# Patient Record
Sex: Female | Born: 1940
Health system: Southern US, Community
[De-identification: ages and names within clinical notes are randomized; demographics above are authoritative.]

## PROBLEM LIST (undated history)

## (undated) DIAGNOSIS — E119 Type 2 diabetes mellitus without complications: Secondary | ICD-10-CM

## (undated) DIAGNOSIS — J45909 Unspecified asthma, uncomplicated: Secondary | ICD-10-CM

## (undated) DIAGNOSIS — M1990 Unspecified osteoarthritis, unspecified site: Secondary | ICD-10-CM

## (undated) DIAGNOSIS — E669 Obesity, unspecified: Secondary | ICD-10-CM

## (undated) DIAGNOSIS — R609 Edema, unspecified: Secondary | ICD-10-CM

## (undated) DIAGNOSIS — E785 Hyperlipidemia, unspecified: Secondary | ICD-10-CM

## (undated) DIAGNOSIS — M8580 Other specified disorders of bone density and structure, unspecified site: Secondary | ICD-10-CM

## (undated) DIAGNOSIS — I10 Essential (primary) hypertension: Secondary | ICD-10-CM

## (undated) HISTORY — DX: Essential (primary) hypertension: I10

---

## 1998-06-16 ENCOUNTER — Other Ambulatory Visit: Admission: RE | Admit: 1998-06-16 | Discharge: 1998-06-16 | Payer: Self-pay | Admitting: Obstetrics and Gynecology

## 1999-07-28 ENCOUNTER — Other Ambulatory Visit: Admission: RE | Admit: 1999-07-28 | Discharge: 1999-07-28 | Payer: Self-pay | Admitting: Obstetrics and Gynecology

## 2001-01-05 ENCOUNTER — Emergency Department (HOSPITAL_COMMUNITY): Admission: EM | Admit: 2001-01-05 | Discharge: 2001-01-05 | Payer: Self-pay | Admitting: Emergency Medicine

## 2001-01-05 ENCOUNTER — Encounter: Payer: Self-pay | Admitting: Emergency Medicine

## 2001-02-19 ENCOUNTER — Other Ambulatory Visit: Admission: RE | Admit: 2001-02-19 | Discharge: 2001-02-19 | Payer: Self-pay | Admitting: Obstetrics and Gynecology

## 2002-10-15 ENCOUNTER — Other Ambulatory Visit: Admission: RE | Admit: 2002-10-15 | Discharge: 2002-10-15 | Payer: Self-pay | Admitting: Internal Medicine

## 2003-11-11 ENCOUNTER — Other Ambulatory Visit: Admission: RE | Admit: 2003-11-11 | Discharge: 2003-11-11 | Payer: Self-pay | Admitting: Obstetrics and Gynecology

## 2004-10-04 ENCOUNTER — Ambulatory Visit (HOSPITAL_COMMUNITY): Admission: RE | Admit: 2004-10-04 | Discharge: 2004-10-04 | Payer: Self-pay | Admitting: *Deleted

## 2004-10-04 ENCOUNTER — Encounter (INDEPENDENT_AMBULATORY_CARE_PROVIDER_SITE_OTHER): Payer: Self-pay | Admitting: Specialist

## 2006-04-24 ENCOUNTER — Other Ambulatory Visit: Admission: RE | Admit: 2006-04-24 | Discharge: 2006-04-24 | Payer: Self-pay | Admitting: Obstetrics and Gynecology

## 2007-04-28 ENCOUNTER — Other Ambulatory Visit: Admission: RE | Admit: 2007-04-28 | Discharge: 2007-04-28 | Payer: Self-pay | Admitting: Obstetrics and Gynecology

## 2008-05-03 ENCOUNTER — Other Ambulatory Visit: Admission: RE | Admit: 2008-05-03 | Discharge: 2008-05-03 | Payer: Self-pay | Admitting: Obstetrics and Gynecology

## 2010-08-22 ENCOUNTER — Ambulatory Visit: Payer: Medicare Other | Attending: Orthopaedic Surgery

## 2010-08-22 DIAGNOSIS — R5381 Other malaise: Secondary | ICD-10-CM | POA: Insufficient documentation

## 2010-08-22 DIAGNOSIS — M2569 Stiffness of other specified joint, not elsewhere classified: Secondary | ICD-10-CM | POA: Insufficient documentation

## 2010-08-22 DIAGNOSIS — M545 Low back pain, unspecified: Secondary | ICD-10-CM | POA: Insufficient documentation

## 2010-08-22 DIAGNOSIS — IMO0001 Reserved for inherently not codable concepts without codable children: Secondary | ICD-10-CM | POA: Insufficient documentation

## 2010-08-24 ENCOUNTER — Ambulatory Visit: Payer: Medicare Other

## 2010-08-30 ENCOUNTER — Ambulatory Visit: Payer: Medicare Other

## 2010-09-01 ENCOUNTER — Ambulatory Visit: Payer: Medicare Other

## 2010-09-06 ENCOUNTER — Ambulatory Visit: Payer: Medicare Other | Admitting: Physical Therapy

## 2010-09-08 ENCOUNTER — Ambulatory Visit: Payer: Medicare Other | Attending: Orthopaedic Surgery

## 2010-09-08 DIAGNOSIS — M545 Low back pain, unspecified: Secondary | ICD-10-CM | POA: Insufficient documentation

## 2010-09-08 DIAGNOSIS — M2569 Stiffness of other specified joint, not elsewhere classified: Secondary | ICD-10-CM | POA: Insufficient documentation

## 2010-09-08 DIAGNOSIS — R5381 Other malaise: Secondary | ICD-10-CM | POA: Insufficient documentation

## 2010-09-08 DIAGNOSIS — IMO0001 Reserved for inherently not codable concepts without codable children: Secondary | ICD-10-CM | POA: Insufficient documentation

## 2010-09-13 ENCOUNTER — Ambulatory Visit: Payer: Medicare Other

## 2010-09-15 ENCOUNTER — Ambulatory Visit: Payer: Medicare Other

## 2010-09-20 ENCOUNTER — Ambulatory Visit: Payer: Medicare Other | Admitting: Physical Therapy

## 2010-09-22 ENCOUNTER — Ambulatory Visit: Payer: Medicare Other

## 2011-07-10 HISTORY — PX: SPINAL FUSION: SHX223

## 2011-08-02 DIAGNOSIS — H2510 Age-related nuclear cataract, unspecified eye: Secondary | ICD-10-CM | POA: Diagnosis not present

## 2011-08-02 DIAGNOSIS — E119 Type 2 diabetes mellitus without complications: Secondary | ICD-10-CM | POA: Diagnosis not present

## 2011-10-04 DIAGNOSIS — E119 Type 2 diabetes mellitus without complications: Secondary | ICD-10-CM | POA: Diagnosis not present

## 2011-10-04 DIAGNOSIS — I10 Essential (primary) hypertension: Secondary | ICD-10-CM | POA: Diagnosis not present

## 2011-10-04 DIAGNOSIS — R609 Edema, unspecified: Secondary | ICD-10-CM | POA: Diagnosis not present

## 2011-10-04 DIAGNOSIS — E782 Mixed hyperlipidemia: Secondary | ICD-10-CM | POA: Diagnosis not present

## 2011-10-12 DIAGNOSIS — M48062 Spinal stenosis, lumbar region with neurogenic claudication: Secondary | ICD-10-CM | POA: Diagnosis not present

## 2011-10-12 DIAGNOSIS — IMO0002 Reserved for concepts with insufficient information to code with codable children: Secondary | ICD-10-CM | POA: Diagnosis not present

## 2011-10-12 DIAGNOSIS — M4310 Spondylolisthesis, site unspecified: Secondary | ICD-10-CM | POA: Diagnosis not present

## 2011-11-15 DIAGNOSIS — I10 Essential (primary) hypertension: Secondary | ICD-10-CM | POA: Diagnosis not present

## 2011-11-15 DIAGNOSIS — M5137 Other intervertebral disc degeneration, lumbosacral region: Secondary | ICD-10-CM | POA: Diagnosis not present

## 2011-11-15 DIAGNOSIS — M48061 Spinal stenosis, lumbar region without neurogenic claudication: Secondary | ICD-10-CM | POA: Diagnosis not present

## 2011-11-15 DIAGNOSIS — Q762 Congenital spondylolisthesis: Secondary | ICD-10-CM | POA: Diagnosis not present

## 2011-11-15 DIAGNOSIS — M48062 Spinal stenosis, lumbar region with neurogenic claudication: Secondary | ICD-10-CM | POA: Diagnosis not present

## 2011-11-15 DIAGNOSIS — R918 Other nonspecific abnormal finding of lung field: Secondary | ICD-10-CM | POA: Diagnosis not present

## 2011-11-15 DIAGNOSIS — IMO0002 Reserved for concepts with insufficient information to code with codable children: Secondary | ICD-10-CM | POA: Diagnosis not present

## 2011-11-15 DIAGNOSIS — R791 Abnormal coagulation profile: Secondary | ICD-10-CM | POA: Diagnosis not present

## 2011-11-15 DIAGNOSIS — R82998 Other abnormal findings in urine: Secondary | ICD-10-CM | POA: Diagnosis not present

## 2011-11-15 DIAGNOSIS — M4120 Other idiopathic scoliosis, site unspecified: Secondary | ICD-10-CM | POA: Diagnosis not present

## 2011-11-15 DIAGNOSIS — M545 Low back pain, unspecified: Secondary | ICD-10-CM | POA: Diagnosis not present

## 2011-11-15 DIAGNOSIS — R7989 Other specified abnormal findings of blood chemistry: Secondary | ICD-10-CM | POA: Diagnosis not present

## 2011-11-15 DIAGNOSIS — E119 Type 2 diabetes mellitus without complications: Secondary | ICD-10-CM | POA: Diagnosis not present

## 2011-11-15 DIAGNOSIS — M47817 Spondylosis without myelopathy or radiculopathy, lumbosacral region: Secondary | ICD-10-CM | POA: Diagnosis not present

## 2011-11-15 DIAGNOSIS — M4310 Spondylolisthesis, site unspecified: Secondary | ICD-10-CM | POA: Diagnosis not present

## 2011-11-15 DIAGNOSIS — Z01818 Encounter for other preprocedural examination: Secondary | ICD-10-CM | POA: Diagnosis not present

## 2011-11-15 DIAGNOSIS — J45909 Unspecified asthma, uncomplicated: Secondary | ICD-10-CM | POA: Diagnosis not present

## 2011-12-05 DIAGNOSIS — E119 Type 2 diabetes mellitus without complications: Secondary | ICD-10-CM | POA: Diagnosis not present

## 2011-12-05 DIAGNOSIS — Z981 Arthrodesis status: Secondary | ICD-10-CM | POA: Diagnosis not present

## 2011-12-05 DIAGNOSIS — M48061 Spinal stenosis, lumbar region without neurogenic claudication: Secondary | ICD-10-CM | POA: Diagnosis not present

## 2011-12-05 DIAGNOSIS — I10 Essential (primary) hypertension: Secondary | ICD-10-CM | POA: Diagnosis not present

## 2011-12-05 DIAGNOSIS — J45909 Unspecified asthma, uncomplicated: Secondary | ICD-10-CM | POA: Diagnosis not present

## 2011-12-05 DIAGNOSIS — M4180 Other forms of scoliosis, site unspecified: Secondary | ICD-10-CM | POA: Diagnosis not present

## 2011-12-05 DIAGNOSIS — M4310 Spondylolisthesis, site unspecified: Secondary | ICD-10-CM | POA: Diagnosis not present

## 2011-12-05 DIAGNOSIS — D72829 Elevated white blood cell count, unspecified: Secondary | ICD-10-CM | POA: Diagnosis not present

## 2011-12-05 DIAGNOSIS — M48062 Spinal stenosis, lumbar region with neurogenic claudication: Secondary | ICD-10-CM | POA: Diagnosis not present

## 2011-12-05 DIAGNOSIS — M4120 Other idiopathic scoliosis, site unspecified: Secondary | ICD-10-CM | POA: Diagnosis present

## 2011-12-05 DIAGNOSIS — Z6841 Body Mass Index (BMI) 40.0 and over, adult: Secondary | ICD-10-CM | POA: Diagnosis not present

## 2011-12-05 DIAGNOSIS — D62 Acute posthemorrhagic anemia: Secondary | ICD-10-CM | POA: Diagnosis not present

## 2011-12-05 DIAGNOSIS — Z5189 Encounter for other specified aftercare: Secondary | ICD-10-CM | POA: Diagnosis not present

## 2011-12-05 DIAGNOSIS — IMO0002 Reserved for concepts with insufficient information to code with codable children: Secondary | ICD-10-CM | POA: Diagnosis not present

## 2011-12-05 DIAGNOSIS — F329 Major depressive disorder, single episode, unspecified: Secondary | ICD-10-CM | POA: Diagnosis present

## 2011-12-10 DIAGNOSIS — Z981 Arthrodesis status: Secondary | ICD-10-CM | POA: Diagnosis not present

## 2011-12-10 DIAGNOSIS — Z6841 Body Mass Index (BMI) 40.0 and over, adult: Secondary | ICD-10-CM | POA: Diagnosis not present

## 2011-12-10 DIAGNOSIS — M7989 Other specified soft tissue disorders: Secondary | ICD-10-CM | POA: Diagnosis not present

## 2011-12-10 DIAGNOSIS — D72829 Elevated white blood cell count, unspecified: Secondary | ICD-10-CM | POA: Diagnosis not present

## 2011-12-10 DIAGNOSIS — J45909 Unspecified asthma, uncomplicated: Secondary | ICD-10-CM | POA: Diagnosis not present

## 2011-12-10 DIAGNOSIS — Z5189 Encounter for other specified aftercare: Secondary | ICD-10-CM | POA: Diagnosis not present

## 2011-12-10 DIAGNOSIS — D62 Acute posthemorrhagic anemia: Secondary | ICD-10-CM | POA: Diagnosis not present

## 2011-12-10 DIAGNOSIS — R259 Unspecified abnormal involuntary movements: Secondary | ICD-10-CM | POA: Diagnosis not present

## 2011-12-10 DIAGNOSIS — IMO0002 Reserved for concepts with insufficient information to code with codable children: Secondary | ICD-10-CM | POA: Diagnosis not present

## 2011-12-10 DIAGNOSIS — R609 Edema, unspecified: Secondary | ICD-10-CM | POA: Diagnosis not present

## 2011-12-10 DIAGNOSIS — M48062 Spinal stenosis, lumbar region with neurogenic claudication: Secondary | ICD-10-CM | POA: Diagnosis not present

## 2011-12-10 DIAGNOSIS — M62838 Other muscle spasm: Secondary | ICD-10-CM | POA: Diagnosis not present

## 2011-12-10 DIAGNOSIS — I10 Essential (primary) hypertension: Secondary | ICD-10-CM | POA: Diagnosis not present

## 2011-12-10 DIAGNOSIS — E119 Type 2 diabetes mellitus without complications: Secondary | ICD-10-CM | POA: Diagnosis not present

## 2011-12-10 DIAGNOSIS — M48061 Spinal stenosis, lumbar region without neurogenic claudication: Secondary | ICD-10-CM | POA: Diagnosis not present

## 2011-12-12 DIAGNOSIS — M7989 Other specified soft tissue disorders: Secondary | ICD-10-CM | POA: Diagnosis not present

## 2011-12-20 ENCOUNTER — Ambulatory Visit: Payer: Medicare Other | Attending: Neurology

## 2011-12-20 DIAGNOSIS — M6281 Muscle weakness (generalized): Secondary | ICD-10-CM | POA: Diagnosis not present

## 2011-12-20 DIAGNOSIS — R5381 Other malaise: Secondary | ICD-10-CM | POA: Insufficient documentation

## 2011-12-20 DIAGNOSIS — IMO0001 Reserved for inherently not codable concepts without codable children: Secondary | ICD-10-CM | POA: Diagnosis not present

## 2011-12-20 DIAGNOSIS — M545 Low back pain, unspecified: Secondary | ICD-10-CM | POA: Insufficient documentation

## 2011-12-20 DIAGNOSIS — R262 Difficulty in walking, not elsewhere classified: Secondary | ICD-10-CM | POA: Diagnosis not present

## 2011-12-25 DIAGNOSIS — M545 Low back pain, unspecified: Secondary | ICD-10-CM | POA: Diagnosis not present

## 2011-12-25 DIAGNOSIS — Z981 Arthrodesis status: Secondary | ICD-10-CM | POA: Diagnosis not present

## 2011-12-26 ENCOUNTER — Ambulatory Visit: Payer: Medicare Other

## 2011-12-26 ENCOUNTER — Other Ambulatory Visit: Payer: Self-pay | Admitting: Family Medicine

## 2011-12-26 DIAGNOSIS — M545 Low back pain: Secondary | ICD-10-CM | POA: Diagnosis not present

## 2011-12-26 DIAGNOSIS — R5381 Other malaise: Secondary | ICD-10-CM | POA: Diagnosis not present

## 2011-12-26 DIAGNOSIS — R262 Difficulty in walking, not elsewhere classified: Secondary | ICD-10-CM | POA: Diagnosis not present

## 2011-12-26 DIAGNOSIS — IMO0001 Reserved for inherently not codable concepts without codable children: Secondary | ICD-10-CM | POA: Diagnosis not present

## 2011-12-26 DIAGNOSIS — R935 Abnormal findings on diagnostic imaging of other abdominal regions, including retroperitoneum: Secondary | ICD-10-CM

## 2011-12-26 DIAGNOSIS — M6281 Muscle weakness (generalized): Secondary | ICD-10-CM | POA: Diagnosis not present

## 2011-12-31 ENCOUNTER — Other Ambulatory Visit: Payer: Medicare Other

## 2012-01-01 ENCOUNTER — Ambulatory Visit
Admission: RE | Admit: 2012-01-01 | Discharge: 2012-01-01 | Disposition: A | Discharge status: Home / Self Care | Payer: Medicare Other | Source: Ambulatory Visit | Attending: Family Medicine | Admitting: Family Medicine

## 2012-01-01 DIAGNOSIS — R7989 Other specified abnormal findings of blood chemistry: Secondary | ICD-10-CM | POA: Diagnosis not present

## 2012-01-01 DIAGNOSIS — R935 Abnormal findings on diagnostic imaging of other abdominal regions, including retroperitoneum: Secondary | ICD-10-CM

## 2012-01-02 ENCOUNTER — Ambulatory Visit: Payer: Medicare Other

## 2012-01-02 DIAGNOSIS — M545 Low back pain: Secondary | ICD-10-CM | POA: Diagnosis not present

## 2012-01-02 DIAGNOSIS — R5381 Other malaise: Secondary | ICD-10-CM | POA: Diagnosis not present

## 2012-01-02 DIAGNOSIS — R262 Difficulty in walking, not elsewhere classified: Secondary | ICD-10-CM | POA: Diagnosis not present

## 2012-01-02 DIAGNOSIS — IMO0001 Reserved for inherently not codable concepts without codable children: Secondary | ICD-10-CM | POA: Diagnosis not present

## 2012-01-02 DIAGNOSIS — M6281 Muscle weakness (generalized): Secondary | ICD-10-CM | POA: Diagnosis not present

## 2012-01-04 ENCOUNTER — Ambulatory Visit: Payer: Medicare Other | Admitting: Physical Therapy

## 2012-01-04 DIAGNOSIS — M6281 Muscle weakness (generalized): Secondary | ICD-10-CM | POA: Diagnosis not present

## 2012-01-04 DIAGNOSIS — IMO0001 Reserved for inherently not codable concepts without codable children: Secondary | ICD-10-CM | POA: Diagnosis not present

## 2012-01-04 DIAGNOSIS — R5381 Other malaise: Secondary | ICD-10-CM | POA: Diagnosis not present

## 2012-01-04 DIAGNOSIS — M545 Low back pain: Secondary | ICD-10-CM | POA: Diagnosis not present

## 2012-01-04 DIAGNOSIS — R262 Difficulty in walking, not elsewhere classified: Secondary | ICD-10-CM | POA: Diagnosis not present

## 2012-01-07 ENCOUNTER — Ambulatory Visit: Payer: Medicare Other | Attending: Neurology

## 2012-01-07 DIAGNOSIS — R262 Difficulty in walking, not elsewhere classified: Secondary | ICD-10-CM | POA: Diagnosis not present

## 2012-01-07 DIAGNOSIS — R5381 Other malaise: Secondary | ICD-10-CM | POA: Diagnosis not present

## 2012-01-07 DIAGNOSIS — M545 Low back pain, unspecified: Secondary | ICD-10-CM | POA: Insufficient documentation

## 2012-01-07 DIAGNOSIS — M6281 Muscle weakness (generalized): Secondary | ICD-10-CM | POA: Insufficient documentation

## 2012-01-07 DIAGNOSIS — IMO0001 Reserved for inherently not codable concepts without codable children: Secondary | ICD-10-CM | POA: Diagnosis not present

## 2012-01-08 DIAGNOSIS — Z981 Arthrodesis status: Secondary | ICD-10-CM | POA: Diagnosis not present

## 2012-01-09 ENCOUNTER — Encounter: Payer: Medicare Other | Admitting: Physical Therapy

## 2012-01-16 ENCOUNTER — Encounter: Payer: Medicare Other | Admitting: Physical Therapy

## 2012-01-18 ENCOUNTER — Encounter: Payer: Medicare Other | Admitting: Physical Therapy

## 2012-01-23 ENCOUNTER — Encounter: Payer: Medicare Other | Admitting: Physical Therapy

## 2012-01-25 ENCOUNTER — Encounter: Payer: Medicare Other | Admitting: Physical Therapy

## 2012-02-14 DIAGNOSIS — J45909 Unspecified asthma, uncomplicated: Secondary | ICD-10-CM | POA: Diagnosis not present

## 2012-02-19 DIAGNOSIS — Z09 Encounter for follow-up examination after completed treatment for conditions other than malignant neoplasm: Secondary | ICD-10-CM | POA: Diagnosis not present

## 2012-02-19 DIAGNOSIS — IMO0002 Reserved for concepts with insufficient information to code with codable children: Secondary | ICD-10-CM | POA: Diagnosis not present

## 2012-02-19 DIAGNOSIS — M5380 Other specified dorsopathies, site unspecified: Secondary | ICD-10-CM | POA: Diagnosis not present

## 2012-02-19 DIAGNOSIS — I7090 Unspecified atherosclerosis: Secondary | ICD-10-CM | POA: Diagnosis not present

## 2012-02-19 DIAGNOSIS — Q762 Congenital spondylolisthesis: Secondary | ICD-10-CM | POA: Diagnosis not present

## 2012-02-19 DIAGNOSIS — M5137 Other intervertebral disc degeneration, lumbosacral region: Secondary | ICD-10-CM | POA: Diagnosis not present

## 2012-02-19 DIAGNOSIS — M129 Arthropathy, unspecified: Secondary | ICD-10-CM | POA: Diagnosis not present

## 2012-02-19 DIAGNOSIS — Z981 Arthrodesis status: Secondary | ICD-10-CM | POA: Diagnosis not present

## 2012-02-20 DIAGNOSIS — J45909 Unspecified asthma, uncomplicated: Secondary | ICD-10-CM | POA: Diagnosis not present

## 2012-02-20 DIAGNOSIS — E119 Type 2 diabetes mellitus without complications: Secondary | ICD-10-CM | POA: Diagnosis not present

## 2012-02-20 DIAGNOSIS — R609 Edema, unspecified: Secondary | ICD-10-CM | POA: Diagnosis not present

## 2012-02-22 DIAGNOSIS — R7989 Other specified abnormal findings of blood chemistry: Secondary | ICD-10-CM | POA: Diagnosis not present

## 2012-03-05 DIAGNOSIS — Z23 Encounter for immunization: Secondary | ICD-10-CM | POA: Diagnosis not present

## 2012-04-21 DIAGNOSIS — Z981 Arthrodesis status: Secondary | ICD-10-CM | POA: Diagnosis not present

## 2012-04-21 DIAGNOSIS — R609 Edema, unspecified: Secondary | ICD-10-CM | POA: Diagnosis not present

## 2012-05-22 DIAGNOSIS — M4180 Other forms of scoliosis, site unspecified: Secondary | ICD-10-CM | POA: Diagnosis not present

## 2012-05-22 DIAGNOSIS — Z4789 Encounter for other orthopedic aftercare: Secondary | ICD-10-CM | POA: Diagnosis not present

## 2012-05-22 DIAGNOSIS — M5137 Other intervertebral disc degeneration, lumbosacral region: Secondary | ICD-10-CM | POA: Diagnosis not present

## 2012-05-22 DIAGNOSIS — IMO0002 Reserved for concepts with insufficient information to code with codable children: Secondary | ICD-10-CM | POA: Diagnosis not present

## 2012-05-22 DIAGNOSIS — Z981 Arthrodesis status: Secondary | ICD-10-CM | POA: Diagnosis not present

## 2012-05-22 DIAGNOSIS — M47817 Spondylosis without myelopathy or radiculopathy, lumbosacral region: Secondary | ICD-10-CM | POA: Diagnosis not present

## 2012-05-22 DIAGNOSIS — M169 Osteoarthritis of hip, unspecified: Secondary | ICD-10-CM | POA: Diagnosis not present

## 2012-08-05 DIAGNOSIS — H2510 Age-related nuclear cataract, unspecified eye: Secondary | ICD-10-CM | POA: Diagnosis not present

## 2012-08-05 DIAGNOSIS — H31009 Unspecified chorioretinal scars, unspecified eye: Secondary | ICD-10-CM | POA: Diagnosis not present

## 2012-08-05 DIAGNOSIS — H3530 Unspecified macular degeneration: Secondary | ICD-10-CM | POA: Diagnosis not present

## 2012-08-05 DIAGNOSIS — E119 Type 2 diabetes mellitus without complications: Secondary | ICD-10-CM | POA: Diagnosis not present

## 2012-08-26 DIAGNOSIS — M1990 Unspecified osteoarthritis, unspecified site: Secondary | ICD-10-CM | POA: Diagnosis not present

## 2012-08-26 DIAGNOSIS — M4120 Other idiopathic scoliosis, site unspecified: Secondary | ICD-10-CM | POA: Diagnosis not present

## 2012-08-26 DIAGNOSIS — Q762 Congenital spondylolisthesis: Secondary | ICD-10-CM | POA: Diagnosis not present

## 2012-08-26 DIAGNOSIS — Z981 Arthrodesis status: Secondary | ICD-10-CM | POA: Diagnosis not present

## 2012-08-26 DIAGNOSIS — Z09 Encounter for follow-up examination after completed treatment for conditions other than malignant neoplasm: Secondary | ICD-10-CM | POA: Diagnosis not present

## 2012-08-26 DIAGNOSIS — Z4789 Encounter for other orthopedic aftercare: Secondary | ICD-10-CM | POA: Diagnosis not present

## 2012-08-26 DIAGNOSIS — M47817 Spondylosis without myelopathy or radiculopathy, lumbosacral region: Secondary | ICD-10-CM | POA: Diagnosis not present

## 2013-02-24 DIAGNOSIS — Q762 Congenital spondylolisthesis: Secondary | ICD-10-CM | POA: Diagnosis not present

## 2013-02-24 DIAGNOSIS — Z981 Arthrodesis status: Secondary | ICD-10-CM | POA: Diagnosis not present

## 2013-02-24 DIAGNOSIS — M1990 Unspecified osteoarthritis, unspecified site: Secondary | ICD-10-CM | POA: Diagnosis not present

## 2013-02-24 DIAGNOSIS — Z4789 Encounter for other orthopedic aftercare: Secondary | ICD-10-CM | POA: Diagnosis not present

## 2013-02-24 DIAGNOSIS — M4120 Other idiopathic scoliosis, site unspecified: Secondary | ICD-10-CM | POA: Diagnosis not present

## 2013-02-25 DIAGNOSIS — E119 Type 2 diabetes mellitus without complications: Secondary | ICD-10-CM | POA: Diagnosis not present

## 2013-02-25 DIAGNOSIS — I10 Essential (primary) hypertension: Secondary | ICD-10-CM | POA: Diagnosis not present

## 2013-02-25 DIAGNOSIS — E782 Mixed hyperlipidemia: Secondary | ICD-10-CM | POA: Diagnosis not present

## 2013-02-25 DIAGNOSIS — R82998 Other abnormal findings in urine: Secondary | ICD-10-CM | POA: Diagnosis not present

## 2013-02-25 DIAGNOSIS — R609 Edema, unspecified: Secondary | ICD-10-CM | POA: Diagnosis not present

## 2013-02-25 DIAGNOSIS — Z1331 Encounter for screening for depression: Secondary | ICD-10-CM | POA: Diagnosis not present

## 2013-02-25 DIAGNOSIS — Z23 Encounter for immunization: Secondary | ICD-10-CM | POA: Diagnosis not present

## 2013-02-27 DIAGNOSIS — D649 Anemia, unspecified: Secondary | ICD-10-CM | POA: Diagnosis not present

## 2013-02-27 DIAGNOSIS — I10 Essential (primary) hypertension: Secondary | ICD-10-CM | POA: Diagnosis not present

## 2013-03-04 DIAGNOSIS — D649 Anemia, unspecified: Secondary | ICD-10-CM | POA: Diagnosis not present

## 2013-03-04 DIAGNOSIS — I10 Essential (primary) hypertension: Secondary | ICD-10-CM | POA: Diagnosis not present

## 2013-04-24 DIAGNOSIS — R195 Other fecal abnormalities: Secondary | ICD-10-CM | POA: Diagnosis not present

## 2013-04-24 DIAGNOSIS — D649 Anemia, unspecified: Secondary | ICD-10-CM | POA: Diagnosis not present

## 2013-05-13 ENCOUNTER — Other Ambulatory Visit (HOSPITAL_COMMUNITY)
Admission: RE | Admit: 2013-05-13 | Discharge: 2013-05-13 | Disposition: A | Discharge status: Home / Self Care | Payer: Medicare Other | Source: Ambulatory Visit | Attending: Gastroenterology | Admitting: Gastroenterology

## 2013-05-13 DIAGNOSIS — D649 Anemia, unspecified: Secondary | ICD-10-CM | POA: Diagnosis not present

## 2013-05-13 DIAGNOSIS — B379 Candidiasis, unspecified: Secondary | ICD-10-CM | POA: Insufficient documentation

## 2013-05-13 DIAGNOSIS — K5730 Diverticulosis of large intestine without perforation or abscess without bleeding: Secondary | ICD-10-CM | POA: Diagnosis not present

## 2013-05-13 DIAGNOSIS — B3781 Candidal esophagitis: Secondary | ICD-10-CM | POA: Diagnosis not present

## 2013-05-13 DIAGNOSIS — R195 Other fecal abnormalities: Secondary | ICD-10-CM | POA: Diagnosis not present

## 2013-05-13 DIAGNOSIS — D1330 Benign neoplasm of unspecified part of small intestine: Secondary | ICD-10-CM | POA: Diagnosis not present

## 2013-05-13 DIAGNOSIS — K648 Other hemorrhoids: Secondary | ICD-10-CM | POA: Diagnosis not present

## 2013-08-07 DIAGNOSIS — H43819 Vitreous degeneration, unspecified eye: Secondary | ICD-10-CM | POA: Diagnosis not present

## 2013-08-07 DIAGNOSIS — D3130 Benign neoplasm of unspecified choroid: Secondary | ICD-10-CM | POA: Diagnosis not present

## 2013-08-07 DIAGNOSIS — H2510 Age-related nuclear cataract, unspecified eye: Secondary | ICD-10-CM | POA: Diagnosis not present

## 2013-08-07 DIAGNOSIS — E119 Type 2 diabetes mellitus without complications: Secondary | ICD-10-CM | POA: Diagnosis not present

## 2013-08-07 DIAGNOSIS — H04129 Dry eye syndrome of unspecified lacrimal gland: Secondary | ICD-10-CM | POA: Diagnosis not present

## 2013-09-01 DIAGNOSIS — Z981 Arthrodesis status: Secondary | ICD-10-CM | POA: Diagnosis not present

## 2013-09-01 DIAGNOSIS — M899 Disorder of bone, unspecified: Secondary | ICD-10-CM | POA: Diagnosis not present

## 2013-09-01 DIAGNOSIS — M4310 Spondylolisthesis, site unspecified: Secondary | ICD-10-CM | POA: Diagnosis not present

## 2013-09-01 DIAGNOSIS — M4120 Other idiopathic scoliosis, site unspecified: Secondary | ICD-10-CM | POA: Diagnosis not present

## 2013-09-01 DIAGNOSIS — M545 Low back pain, unspecified: Secondary | ICD-10-CM | POA: Diagnosis not present

## 2013-09-01 DIAGNOSIS — M5137 Other intervertebral disc degeneration, lumbosacral region: Secondary | ICD-10-CM | POA: Diagnosis not present

## 2014-01-20 DIAGNOSIS — E119 Type 2 diabetes mellitus without complications: Secondary | ICD-10-CM | POA: Diagnosis not present

## 2014-01-20 DIAGNOSIS — I10 Essential (primary) hypertension: Secondary | ICD-10-CM | POA: Diagnosis not present

## 2014-01-20 DIAGNOSIS — R32 Unspecified urinary incontinence: Secondary | ICD-10-CM | POA: Diagnosis not present

## 2014-02-03 DIAGNOSIS — N319 Neuromuscular dysfunction of bladder, unspecified: Secondary | ICD-10-CM | POA: Diagnosis not present

## 2014-02-03 DIAGNOSIS — N3941 Urge incontinence: Secondary | ICD-10-CM | POA: Diagnosis not present

## 2014-02-03 DIAGNOSIS — R350 Frequency of micturition: Secondary | ICD-10-CM | POA: Diagnosis not present

## 2014-02-11 DIAGNOSIS — N319 Neuromuscular dysfunction of bladder, unspecified: Secondary | ICD-10-CM | POA: Diagnosis not present

## 2014-02-11 DIAGNOSIS — R350 Frequency of micturition: Secondary | ICD-10-CM | POA: Diagnosis not present

## 2014-02-11 DIAGNOSIS — N3941 Urge incontinence: Secondary | ICD-10-CM | POA: Diagnosis not present

## 2014-02-18 DIAGNOSIS — N3941 Urge incontinence: Secondary | ICD-10-CM | POA: Diagnosis not present

## 2014-02-18 DIAGNOSIS — N319 Neuromuscular dysfunction of bladder, unspecified: Secondary | ICD-10-CM | POA: Diagnosis not present

## 2014-03-09 DIAGNOSIS — M4120 Other idiopathic scoliosis, site unspecified: Secondary | ICD-10-CM | POA: Diagnosis not present

## 2014-03-09 DIAGNOSIS — M5137 Other intervertebral disc degeneration, lumbosacral region: Secondary | ICD-10-CM | POA: Diagnosis not present

## 2014-03-09 DIAGNOSIS — M4310 Spondylolisthesis, site unspecified: Secondary | ICD-10-CM | POA: Diagnosis not present

## 2014-03-09 DIAGNOSIS — M899 Disorder of bone, unspecified: Secondary | ICD-10-CM | POA: Diagnosis not present

## 2014-03-09 DIAGNOSIS — Z981 Arthrodesis status: Secondary | ICD-10-CM | POA: Diagnosis not present

## 2014-03-09 DIAGNOSIS — M51379 Other intervertebral disc degeneration, lumbosacral region without mention of lumbar back pain or lower extremity pain: Secondary | ICD-10-CM | POA: Diagnosis not present

## 2014-04-29 DIAGNOSIS — Z23 Encounter for immunization: Secondary | ICD-10-CM | POA: Diagnosis not present

## 2014-08-31 DIAGNOSIS — E119 Type 2 diabetes mellitus without complications: Secondary | ICD-10-CM | POA: Diagnosis not present

## 2014-08-31 DIAGNOSIS — H04123 Dry eye syndrome of bilateral lacrimal glands: Secondary | ICD-10-CM | POA: Diagnosis not present

## 2014-08-31 DIAGNOSIS — H43813 Vitreous degeneration, bilateral: Secondary | ICD-10-CM | POA: Diagnosis not present

## 2014-08-31 DIAGNOSIS — D3132 Benign neoplasm of left choroid: Secondary | ICD-10-CM | POA: Diagnosis not present

## 2014-08-31 DIAGNOSIS — H2513 Age-related nuclear cataract, bilateral: Secondary | ICD-10-CM | POA: Diagnosis not present

## 2015-03-18 DIAGNOSIS — M4316 Spondylolisthesis, lumbar region: Secondary | ICD-10-CM | POA: Diagnosis not present

## 2015-03-18 DIAGNOSIS — Z87891 Personal history of nicotine dependence: Secondary | ICD-10-CM | POA: Diagnosis not present

## 2015-03-18 DIAGNOSIS — M8580 Other specified disorders of bone density and structure, unspecified site: Secondary | ICD-10-CM | POA: Diagnosis not present

## 2015-03-18 DIAGNOSIS — Z7982 Long term (current) use of aspirin: Secondary | ICD-10-CM | POA: Diagnosis not present

## 2015-03-18 DIAGNOSIS — Z981 Arthrodesis status: Secondary | ICD-10-CM | POA: Diagnosis not present

## 2015-03-18 DIAGNOSIS — I10 Essential (primary) hypertension: Secondary | ICD-10-CM | POA: Diagnosis not present

## 2015-03-18 DIAGNOSIS — Z4889 Encounter for other specified surgical aftercare: Secondary | ICD-10-CM | POA: Diagnosis not present

## 2015-03-18 DIAGNOSIS — M5137 Other intervertebral disc degeneration, lumbosacral region: Secondary | ICD-10-CM | POA: Diagnosis not present

## 2015-03-25 DIAGNOSIS — R319 Hematuria, unspecified: Secondary | ICD-10-CM | POA: Diagnosis not present

## 2015-03-25 DIAGNOSIS — R310 Gross hematuria: Secondary | ICD-10-CM | POA: Diagnosis not present

## 2015-03-25 DIAGNOSIS — I10 Essential (primary) hypertension: Secondary | ICD-10-CM | POA: Diagnosis not present

## 2015-03-25 DIAGNOSIS — N3281 Overactive bladder: Secondary | ICD-10-CM | POA: Diagnosis not present

## 2015-03-25 DIAGNOSIS — E119 Type 2 diabetes mellitus without complications: Secondary | ICD-10-CM | POA: Diagnosis not present

## 2015-03-31 DIAGNOSIS — R310 Gross hematuria: Secondary | ICD-10-CM | POA: Diagnosis not present

## 2015-03-31 DIAGNOSIS — N200 Calculus of kidney: Secondary | ICD-10-CM | POA: Diagnosis not present

## 2015-05-11 ENCOUNTER — Other Ambulatory Visit (HOSPITAL_COMMUNITY)
Admission: RE | Admit: 2015-05-11 | Discharge: 2015-05-11 | Disposition: A | Discharge status: Home / Self Care | Payer: Medicare Other | Source: Ambulatory Visit | Attending: Obstetrics and Gynecology | Admitting: Obstetrics and Gynecology

## 2015-05-11 ENCOUNTER — Other Ambulatory Visit: Payer: Self-pay | Admitting: Obstetrics and Gynecology

## 2015-05-11 DIAGNOSIS — Z01419 Encounter for gynecological examination (general) (routine) without abnormal findings: Secondary | ICD-10-CM | POA: Diagnosis not present

## 2015-05-11 DIAGNOSIS — N950 Postmenopausal bleeding: Secondary | ICD-10-CM | POA: Diagnosis not present

## 2015-05-11 DIAGNOSIS — Z1151 Encounter for screening for human papillomavirus (HPV): Secondary | ICD-10-CM | POA: Diagnosis not present

## 2015-05-11 DIAGNOSIS — Z124 Encounter for screening for malignant neoplasm of cervix: Secondary | ICD-10-CM | POA: Diagnosis not present

## 2015-05-16 LAB — CYTOLOGY - PAP

## 2015-05-19 DIAGNOSIS — R938 Abnormal findings on diagnostic imaging of other specified body structures: Secondary | ICD-10-CM | POA: Diagnosis not present

## 2015-05-26 DIAGNOSIS — N950 Postmenopausal bleeding: Secondary | ICD-10-CM | POA: Diagnosis not present

## 2015-05-26 DIAGNOSIS — R938 Abnormal findings on diagnostic imaging of other specified body structures: Secondary | ICD-10-CM | POA: Diagnosis not present

## 2015-11-18 DIAGNOSIS — H2513 Age-related nuclear cataract, bilateral: Secondary | ICD-10-CM | POA: Diagnosis not present

## 2015-11-18 DIAGNOSIS — D3131 Benign neoplasm of right choroid: Secondary | ICD-10-CM | POA: Diagnosis not present

## 2015-11-18 DIAGNOSIS — E119 Type 2 diabetes mellitus without complications: Secondary | ICD-10-CM | POA: Diagnosis not present

## 2015-11-18 DIAGNOSIS — H40011 Open angle with borderline findings, low risk, right eye: Secondary | ICD-10-CM | POA: Diagnosis not present

## 2015-12-21 DIAGNOSIS — M62838 Other muscle spasm: Secondary | ICD-10-CM | POA: Diagnosis not present

## 2015-12-21 DIAGNOSIS — R030 Elevated blood-pressure reading, without diagnosis of hypertension: Secondary | ICD-10-CM | POA: Diagnosis not present

## 2015-12-21 DIAGNOSIS — M25512 Pain in left shoulder: Secondary | ICD-10-CM | POA: Diagnosis not present

## 2015-12-30 ENCOUNTER — Encounter (HOSPITAL_COMMUNITY): Payer: Self-pay | Admitting: *Deleted

## 2015-12-30 ENCOUNTER — Emergency Department (HOSPITAL_COMMUNITY): Payer: Medicare Other

## 2015-12-30 ENCOUNTER — Other Ambulatory Visit: Payer: Self-pay

## 2015-12-30 ENCOUNTER — Emergency Department (HOSPITAL_COMMUNITY)
Admission: EM | Admit: 2015-12-30 | Discharge: 2015-12-30 | Disposition: A | Discharge status: Home / Self Care | Payer: Medicare Other | Attending: Emergency Medicine | Admitting: Emergency Medicine

## 2015-12-30 DIAGNOSIS — I10 Essential (primary) hypertension: Secondary | ICD-10-CM | POA: Diagnosis not present

## 2015-12-30 DIAGNOSIS — E785 Hyperlipidemia, unspecified: Secondary | ICD-10-CM | POA: Insufficient documentation

## 2015-12-30 DIAGNOSIS — Z87891 Personal history of nicotine dependence: Secondary | ICD-10-CM | POA: Insufficient documentation

## 2015-12-30 DIAGNOSIS — E119 Type 2 diabetes mellitus without complications: Secondary | ICD-10-CM | POA: Diagnosis not present

## 2015-12-30 DIAGNOSIS — R079 Chest pain, unspecified: Secondary | ICD-10-CM | POA: Diagnosis not present

## 2015-12-30 DIAGNOSIS — M5410 Radiculopathy, site unspecified: Secondary | ICD-10-CM

## 2015-12-30 DIAGNOSIS — Z79899 Other long term (current) drug therapy: Secondary | ICD-10-CM | POA: Diagnosis not present

## 2015-12-30 DIAGNOSIS — J45909 Unspecified asthma, uncomplicated: Secondary | ICD-10-CM | POA: Insufficient documentation

## 2015-12-30 DIAGNOSIS — Z7984 Long term (current) use of oral hypoglycemic drugs: Secondary | ICD-10-CM | POA: Insufficient documentation

## 2015-12-30 DIAGNOSIS — M25512 Pain in left shoulder: Secondary | ICD-10-CM | POA: Insufficient documentation

## 2015-12-30 DIAGNOSIS — Z7982 Long term (current) use of aspirin: Secondary | ICD-10-CM | POA: Diagnosis not present

## 2015-12-30 HISTORY — DX: Other specified disorders of bone density and structure, unspecified site: M85.80

## 2015-12-30 HISTORY — DX: Type 2 diabetes mellitus without complications: E11.9

## 2015-12-30 HISTORY — DX: Unspecified osteoarthritis, unspecified site: M19.90

## 2015-12-30 HISTORY — DX: Obesity, unspecified: E66.9

## 2015-12-30 HISTORY — DX: Unspecified asthma, uncomplicated: J45.909

## 2015-12-30 HISTORY — DX: Edema, unspecified: R60.9

## 2015-12-30 HISTORY — DX: Hyperlipidemia, unspecified: E78.5

## 2015-12-30 LAB — BASIC METABOLIC PANEL
Anion gap: 9 (ref 5–15)
BUN: 10 mg/dL (ref 6–20)
CALCIUM: 9.3 mg/dL (ref 8.9–10.3)
CHLORIDE: 103 mmol/L (ref 101–111)
CO2: 28 mmol/L (ref 22–32)
CREATININE: 0.75 mg/dL (ref 0.44–1.00)
GFR calc Af Amer: >60 mL/min (ref >60)
GFR calc non Af Amer: >60 mL/min (ref >60)
GLUCOSE: 124 mg/dL — AB (ref 65–99)
Potassium: 4.1 mmol/L (ref 3.5–5.1)
Sodium: 140 mmol/L (ref 135–145)

## 2015-12-30 LAB — CBC
HCT: 40.6 % (ref 36.0–46.0)
Hemoglobin: 12.7 g/dL (ref 12.0–15.0)
MCH: 29.7 pg (ref 26.0–34.0)
MCHC: 31.3 g/dL (ref 30.0–36.0)
MCV: 95.1 fL (ref 78.0–100.0)
PLATELETS: 189 10*3/uL (ref 150–400)
RBC: 4.27 MIL/uL (ref 3.87–5.11)
RDW: 14 % (ref 11.5–15.5)
WBC: 7.2 10*3/uL (ref 4.0–10.5)

## 2015-12-30 LAB — I-STAT TROPONIN, ED: Troponin i, poc: 0.01 ng/mL (ref 0.00–0.08)

## 2015-12-30 MED ORDER — MORPHINE SULFATE (PF) 4 MG/ML IV SOLN
4.0000 mg | Freq: Once | INTRAVENOUS | Status: AC
  Administered 2015-12-30: 4 mg via INTRAVENOUS
  Filled 2015-12-30: qty 1

## 2015-12-30 MED ORDER — TRAMADOL HCL 50 MG PO TABS: 50.0000 mg | ORAL_TABLET | Freq: Four times a day (QID) | ORAL | Status: DC | PRN

## 2015-12-30 NOTE — ED Provider Notes (Addendum)
CSN: [id number]     Arrival date & time 12/30/15  1605 History   First MD Initiated Contact with Patient 12/30/15 1847     Chief Complaint  Patient presents with  . Shoulder Pain  . Arm Pain     (Consider location/radiation/quality/duration/timing/severity/associated sxs/prior Treatment) HPI Comments: 75 year old female with history of diabetes, past smoker, high blood pressure, asthma presents with recurrent left shoulder pain for the past week. Patient was seen at urgent care last week and given anti-inflammatories with no significant improvement. Patient has mild radiation down left arm. Patient has no cardiac history known. No neurologic symptoms. No injuries however pain is reproduced with movement. No upper abdominal pain. No history of surgery on the neck or left shoulder.  Patient is a 75 y.o. female presenting with shoulder pain and arm pain. The history is provided by the patient.  Shoulder Pain Associated symptoms: no back pain, no fever and no neck pain   Arm Pain Pertinent negatives include no chest pain, no abdominal pain, no headaches and no shortness of breath.    Past Medical History  Diagnosis Date  . Arthritis   . Diabetes mellitus without complication ([place])   . Asthma   . Hyperlipemia   . Peripheral edema   . Obesity   . Osteopenia    History reviewed. No pertinent past surgical history. History reviewed. No pertinent family history. Social History  Substance Use Topics  . Smoking status: Former Research scientist (life sciences)  . Smokeless tobacco: None  . Alcohol Use: No   OB History    No data available     Review of Systems  Constitutional: Negative for fever and chills.  HENT: Negative for congestion.   Eyes: Negative for visual disturbance.  Respiratory: Negative for shortness of breath.   Cardiovascular: Negative for chest pain.  Gastrointestinal: Negative for vomiting and abdominal pain.  Genitourinary: Negative for dysuria and flank pain.  Musculoskeletal:  Positive for arthralgias. Negative for back pain, joint swelling, neck pain and neck stiffness.  Skin: Negative for rash.  Neurological: Negative for light-headedness and headaches.      Allergies  Ibuprofen and Vicodin  Home Medications   Prior to Admission medications   Medication Sig Start Date End Date Taking? Authorizing Provider  aspirin EC 81 MG tablet Take 81 mg by mouth.   Yes Historical Provider, MD  atorvastatin (LIPITOR) 40 MG tablet Take 40 mg by mouth.   Yes Historical Provider, MD  baclofen (LIORESAL) 10 MG tablet  12/21/15  Yes Historical Provider, MD  losartan (COZAAR) 50 MG tablet Take 50 mg by mouth 2 (two) times daily.   Yes Historical Provider, MD  meloxicam (MOBIC) 7.5 MG tablet  12/21/15  Yes Historical Provider, MD  Multiple Vitamin (MULTIVITAMIN) capsule Take by mouth.   Yes Historical Provider, MD  pioglitazone (ACTOS) 30 MG tablet Take 30 mg by mouth.   Yes Historical Provider, MD  traMADol (ULTRAM) 50 MG tablet Take 1 tablet (50 mg total) by mouth every 6 (six) hours as needed. 12/30/15   Elnora Morrison, MD   BP 208/87 mmHg  Pulse 83  Temp(Src) 99.2 F (37.3 C) (Oral)  Resp 20  SpO2 97% Physical Exam  Constitutional: She is oriented to person, place, and time. She appears well-developed and well-nourished.  HENT:  Head: Normocephalic and atraumatic.  Eyes: Conjunctivae are normal. Right eye exhibits no discharge. Left eye exhibits no discharge.  Neck: Normal range of motion. Neck supple. No tracheal deviation present.  Cardiovascular: Normal rate,  regular rhythm and intact distal pulses.   Pulmonary/Chest: Effort normal and breath sounds normal.  Abdominal: Soft. She exhibits no distension. There is no tenderness. There is no guarding.  Musculoskeletal: She exhibits tenderness. She exhibits no edema.  Patient has tenderness with range of motion of left shoulder. No warmth or edema. Patient has 5+ strength with flexion extension of wrists elbows and  shoulders.  Neurological: She is alert and oriented to person, place, and time.  Skin: Skin is warm. No rash noted.  Psychiatric: She has a normal mood and affect.  Nursing note and vitals reviewed.   ED Course  Procedures (including critical care time) Labs Review Labs Reviewed  BASIC METABOLIC PANEL - Abnormal; Notable for the following:    Glucose, Bld 124 (*)    All other components within normal limits  CBC  I-STAT TROPOININ, ED    Imaging Review Dg Chest 2 View  12/30/2015  CLINICAL DATA:  LEFT shoulder pain, chest pain, history asthma, diabetes mellitus, obesity, former smoker EXAM: CHEST  2 VIEW COMPARISON:  None FINDINGS: Upper normal heart size. Mediastinal contours and pulmonary vascularity normal. Lungs clear. No pleural effusion or pneumothorax. Bones demineralized. IMPRESSION: No acute abnormalities. Electronically Signed   By: Lavonia Dana M.D.   On: 12/30/2015 18:51   I have personally reviewed and evaluated these images and lab results as part of my medical decision-making.   EKG Interpretation   Date/Time:  Friday December 30 2015 16:35:06 EDT Ventricular Rate:  85 PR Interval:  202 QRS Duration: 80 QT Interval:  358 QTC Calculation: 426 R Axis:   46 Text Interpretation:  Normal sinus rhythm Normal ECG Confirmed by Amel Kitch  MD, Alverta Caccamo [phone]) on 12/30/2015 7:55:14 PM      MDM   Final diagnoses:  Left shoulder pain  Essential hypertension   Patient presents with recurrent left shoulder pain worse with movement. Patient has equal pulses bilateral, normal neurologic exam. Patient has muscle skeletal/radiculopathy presentation. With age and risk factors cardiac screen in the ER negative troponin, EKG unremarkable.  Discussed close follow-up with orthopedics and primary Dr. for possible stress test if no improvement. Patient has had uncontrolled high blood pressure for a while and will follow-up with primary Dr. for this. Pain meds in the ER.  Results and  differential diagnosis were discussed with the patient/parent/guardian. Xrays were independently reviewed by myself.  Close follow up outpatient was discussed, comfortable with the plan.   Medications  morphine 4 MG/ML injection 4 mg (4 mg Intravenous Given 12/30/15 1948)    Filed Vitals:   12/30/15 1633 12/30/15 1933  BP: 189/83 208/87  Pulse: 96 83  Temp: 99.2 F (37.3 C)   TempSrc: Oral   Resp: 20   SpO2: 94% 97%    Final diagnoses:  Left shoulder pain  Essential hypertension        Elnora Morrison, MD 12/30/15 [id number]  Elnora Morrison, MD 12/30/15 1958

## 2015-12-30 NOTE — ED Notes (Signed)
Pt was seen at [facility] last week due to dull left shoulder pain from posterior to anterior. Denies injury. Was prescribed medications but no relief. Went to [facility] today and sent here for further eval.  Pain now radiates down left arm. ekg done on arrival.

## 2015-12-30 NOTE — Discharge Instructions (Signed)
If you were given medicines take as directed.  If you are on coumadin or contraceptives realize their levels and effectiveness is altered by many different medicines.  If you have any reaction (rash, tongues swelling, other) to the medicines stop taking and see a physician.    If your blood pressure was elevated in the ER make sure you follow up for management with a primary doctor or return for chest pain, shortness of breath or stroke symptoms.  Please follow up as directed and return to the ER or see a physician for new or worsening symptoms.  Thank you. Filed Vitals:   12/30/15 1633 12/30/15 1933  BP: 189/83 208/87  Pulse: 96 83  Temp: 99.2 F (37.3 C)   TempSrc: Oral   Resp: 20   SpO2: 94% 97%

## 2016-01-05 DIAGNOSIS — M7552 Bursitis of left shoulder: Secondary | ICD-10-CM | POA: Diagnosis not present

## 2016-01-05 DIAGNOSIS — M542 Cervicalgia: Secondary | ICD-10-CM | POA: Diagnosis not present

## 2016-01-11 DIAGNOSIS — M25512 Pain in left shoulder: Secondary | ICD-10-CM | POA: Diagnosis not present

## 2016-01-11 DIAGNOSIS — F439 Reaction to severe stress, unspecified: Secondary | ICD-10-CM | POA: Diagnosis not present

## 2016-01-12 ENCOUNTER — Telehealth: Payer: Self-pay | Admitting: Cardiology

## 2016-01-12 NOTE — Telephone Encounter (Signed)
Called pt and left message for pt to call back to update Fm and medical hx.

## 2016-01-17 ENCOUNTER — Encounter: Payer: Self-pay | Admitting: Cardiology

## 2016-01-17 ENCOUNTER — Telehealth: Payer: Self-pay | Admitting: Cardiology

## 2016-01-17 NOTE — Telephone Encounter (Signed)
Called pt back to update pt's Fm and medical Hx.

## 2016-01-17 NOTE — Telephone Encounter (Signed)
Follow-up     The pt is calling concerning medical history is needs to provide for her test wants to speak with a nurse.

## 2016-01-17 NOTE — Telephone Encounter (Signed)
Derl Barrow at 01/12/2016 12:53 PM     Status: Signed       Expand All Collapse All   Called pt and left message for pt to call back to update Fm and medical hx.        To Chart Prep. Patient has OV with Dr. Radford Pax 7/14.

## 2016-01-20 ENCOUNTER — Ambulatory Visit (INDEPENDENT_AMBULATORY_CARE_PROVIDER_SITE_OTHER): Payer: Medicare Other | Admitting: Cardiology

## 2016-01-20 ENCOUNTER — Encounter: Payer: Self-pay | Admitting: Cardiology

## 2016-01-20 VITALS — BP 174/81 | HR 87 | Ht 65.0 in | Wt 277.4 lb

## 2016-01-20 DIAGNOSIS — R030 Elevated blood-pressure reading, without diagnosis of hypertension: Secondary | ICD-10-CM | POA: Diagnosis not present

## 2016-01-20 DIAGNOSIS — IMO0001 Reserved for inherently not codable concepts without codable children: Secondary | ICD-10-CM | POA: Insufficient documentation

## 2016-01-20 DIAGNOSIS — M79602 Pain in left arm: Secondary | ICD-10-CM

## 2016-01-20 DIAGNOSIS — R0602 Shortness of breath: Secondary | ICD-10-CM

## 2016-01-20 DIAGNOSIS — I10 Essential (primary) hypertension: Secondary | ICD-10-CM

## 2016-01-20 DIAGNOSIS — Z8249 Family history of ischemic heart disease and other diseases of the circulatory system: Secondary | ICD-10-CM

## 2016-01-20 HISTORY — DX: Essential (primary) hypertension: I10

## 2016-01-20 NOTE — Patient Instructions (Signed)
Medication Instructions:  Your physician recommends that you continue on your current medications as directed. Please refer to the Current Medication list given to you today.   Labwork: None  Testing/Procedures: Your physician has requested that you have a lexiscan myoview. For further information please visit [email or web address]. Please follow instruction sheet, as given.  Follow-Up: Your physician recommends that you schedule a follow-up appointment AS NEEDED with Dr. Radford Pax pending study results.  Any Other Special Instructions Will Be Listed Below (If Applicable).     If you need a refill on your cardiac medications before your next appointment, please call your pharmacy.

## 2016-01-20 NOTE — Progress Notes (Signed)
Cardiology Office Note    Date:  01/20/2016   ID:  [name], DOB [birth year], MRN [id number]  PCP:  Gennette Pac, MD  Cardiologist:  Fransico Him, MD   Chief Complaint  Patient presents with  . Follow-up    left arm pain  . New Evaluation    History of Present Illness:  Colleen Chambers is a 75 y.o. female with a history of DM and hyperlipidemia presented to the ER recently with complaints of recurrent left shoulder pain for over a week.  She iniitially awakened with the shoulder pain and was treated with an antiinlammatory and muscle relaxant for 10 days without any improvement.  There was some mild radiation down the left arm and pain was worse with movement  She has a remote history of tobacco use and has a ? History of angina in her mother.  Due to her CRFs she was sent to the ER for further evaluation and workup was normal.  She is now referred by her PCP to further evaluate due to family history and tobacco use in the past.  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  She denies any exertional chest pain,LE edema, dizziness, palpitations or syncope.  Workup in the ER was negative. She has a history of asthma and has had intermittent SOB from that in the past but that has been stable.  She has chronic LE edema.  She denies any dizziness or palpitations.      Past Medical History  Diagnosis Date  . Arthritis   . Diabetes mellitus without complication ([place])   . Asthma   . Hyperlipemia   . Peripheral edema   . Obesity   . Osteopenia   . Benign essential HTN 01/20/2016    Past Surgical History  Procedure Laterality Date  . Spinal fusion  2013    Current Medications: Outpatient Prescriptions Prior to Visit  Medication Sig Dispense Refill  . aspirin EC 81 MG tablet Take 81 mg by mouth.    Marland Kitchen atorvastatin (LIPITOR) 40 MG tablet Take 40 mg by mouth.    . losartan (COZAAR) 50 MG tablet Take 50 mg by mouth 2 (two) times  daily.    . meloxicam (MOBIC) 7.5 MG tablet   0  . Multiple Vitamin (MULTIVITAMIN) capsule Take by mouth.    . pioglitazone (ACTOS) 30 MG tablet Take 30 mg by mouth.    . baclofen (LIORESAL) 10 MG tablet Reported on 01/20/2016  0  . traMADol (ULTRAM) 50 MG tablet Take 1 tablet (50 mg total) by mouth every 6 (six) hours as needed. (Patient not taking: Reported on 01/20/2016) 15 tablet 0   No facility-administered medications prior to visit.     Allergies:   Ibuprofen and Vicodin   Social History   Social History  . Marital Status: Married    Spouse Name: Jeneen Rinks  . Number of Children: 2  . Years of Education: 12   Occupational History  . retired    Social History Main Topics  . Smoking status: Former Research scientist (life sciences)  . Smokeless tobacco: None  . Alcohol Use: No  . Drug Use: No  . Sexual Activity: Not Asked   Other Topics Concern  . None   Social History Narrative     Family History:  The patient's family history includes Angina in her mother; Diabetes in her father; Stroke (age of onset: 74) in her father; Stroke (age of onset:  91) in her mother.   ROS:   Please see the history of present illness.    ROS All other systems reviewed and are negative.   PHYSICAL EXAM:   VS:  BP 174/81 mmHg  Pulse 87  Ht 5\' 5"  (1.651 m)  Wt 277 lb 6.4 oz (125.828 kg)  BMI 46.16 kg/m2   GEN: Well nourished, well developed, in no acute distress HEENT: normal Neck: no JVD, carotid bruits, or masses Cardiac: RRR; no murmurs, rubs, or gallops,no edema.  Intact distal pulses bilaterally.  Respiratory:  clear to auscultation bilaterally, normal work of breathing GI: soft, nontender, nondistended, + BS MS: no deformity or atrophy Skin: warm and dry, no rash Neuro:  Alert and Oriented x 3, Strength and sensation are intact Psych: euthymic mood, full affect  Wt Readings from Last 3 Encounters:  01/20/16 277 lb 6.4 oz (125.828 kg)      Studies/Labs Reviewed:   EKG:  EKG is not ordered today.    Recent Labs: 12/30/2015: BUN 10; Creatinine, Ser 0.75; Hemoglobin 12.7; Platelets 189; Potassium 4.1; Sodium 140   Lipid Panel No results found for: CHOL, TRIG, HDL, CHOLHDL, VLDL, LDLCALC, LDLDIRECT  Additional studies/ records that were reviewed today include:  Hospital records    ASSESSMENT:    1. Left arm pain   2. Family history of coronary artery disease   3. Elevated blood pressure      PLAN:  In order of problems listed above:  1. 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  This does not sound like angina. 2. Family history of CAD with remote tobacco use, obesity and DM.  Would recommend proceeding with Lexiscan myoview to rule out silent ischemia.  3. Elevated BP - her PCP is aware of this and thinks it is due to stress as she is moving right now.  Will have her PCP followup on this.    Medication Adjustments/Labs and Tests Ordered: Current medicines are reviewed at length with the patient today.  Concerns regarding medicines are outlined above.  Medication changes, Labs and Tests ordered today are listed in the Patient Instructions below.  There are no Patient Instructions on file for this visit.   Signed, Fransico Him, MD  01/20/2016 2:25 PM    [facility] [place], [place], [place]  [address] Phone: [phone]; Fax: [phone]

## 2016-02-02 DIAGNOSIS — G5602 Carpal tunnel syndrome, left upper limb: Secondary | ICD-10-CM | POA: Diagnosis not present

## 2016-02-07 ENCOUNTER — Telehealth (HOSPITAL_COMMUNITY): Payer: Self-pay | Admitting: *Deleted

## 2016-02-07 NOTE — Telephone Encounter (Signed)
Patient given detailed instructions per Myocardial Perfusion Study Information Sheet for the test on  02/09/16. Patient notified to arrive 15 minutes early and that it is imperative to arrive on time for appointment to keep from having the test rescheduled.  If you need to cancel or reschedule your appointment, please call the office within 24 hours of your appointment. Failure to do so may result in a cancellation of your appointment, and a $50 no show fee. Patient verbalized understanding. Hubbard Robinson, RN

## 2016-02-09 ENCOUNTER — Ambulatory Visit (HOSPITAL_COMMUNITY): Payer: Self-pay

## 2016-02-09 ENCOUNTER — Ambulatory Visit (HOSPITAL_COMMUNITY): Payer: Medicare Other | Attending: Cardiology

## 2016-02-09 DIAGNOSIS — R0602 Shortness of breath: Secondary | ICD-10-CM | POA: Diagnosis not present

## 2016-02-09 DIAGNOSIS — M79602 Pain in left arm: Secondary | ICD-10-CM

## 2016-02-09 DIAGNOSIS — I10 Essential (primary) hypertension: Secondary | ICD-10-CM | POA: Insufficient documentation

## 2016-02-09 DIAGNOSIS — Z8249 Family history of ischemic heart disease and other diseases of the circulatory system: Secondary | ICD-10-CM

## 2016-02-09 MED ORDER — TECHNETIUM TC 99M TETROFOSMIN IV KIT
33.0000 | PACK | Freq: Once | INTRAVENOUS | Status: AC | PRN
  Administered 2016-02-09: 33 via INTRAVENOUS
  Filled 2016-02-09: qty 33

## 2016-02-09 MED ORDER — REGADENOSON 0.4 MG/5ML IV SOLN
0.4000 mg | Freq: Once | INTRAVENOUS | Status: AC
  Administered 2016-02-09: 0.4 mg via INTRAVENOUS

## 2016-02-10 ENCOUNTER — Ambulatory Visit (HOSPITAL_COMMUNITY): Payer: Self-pay

## 2016-02-10 ENCOUNTER — Ambulatory Visit (HOSPITAL_COMMUNITY): Payer: Medicare Other | Attending: Cardiology

## 2016-02-10 LAB — MYOCARDIAL PERFUSION IMAGING
CHL CUP NUCLEAR SDS: 4
CHL CUP NUCLEAR SRS: 3
CHL CUP NUCLEAR SSS: 7
CSEPPHR: 101 {beats}/min
LHR: 0.26
LV sys vol: 40 mL
LVDIAVOL: 127 mL (ref 46–106)
Rest HR: 78 {beats}/min
TID: 0.96

## 2016-02-10 MED ORDER — TECHNETIUM TC 99M TETROFOSMIN IV KIT
32.4000 | PACK | Freq: Once | INTRAVENOUS | Status: AC | PRN
  Administered 2016-02-10: 32 via INTRAVENOUS
  Filled 2016-02-10: qty 32

## 2016-02-13 ENCOUNTER — Telehealth: Payer: Self-pay

## 2016-02-13 DIAGNOSIS — R9439 Abnormal result of other cardiovascular function study: Secondary | ICD-10-CM

## 2016-02-13 DIAGNOSIS — M79602 Pain in left arm: Secondary | ICD-10-CM

## 2016-02-13 DIAGNOSIS — Z8249 Family history of ischemic heart disease and other diseases of the circulatory system: Secondary | ICD-10-CM

## 2016-02-13 NOTE — Telephone Encounter (Signed)
-----   Message from Sueanne Margarita, MD sent at 02/12/2016  6:08 PM EDT ----- Equivocal nuclear stress test with ischemia vs. Bowel and diaphragmatic attenuation.  CP was atypical.  Please get a Coronary CTA with morphology and calcium score to evaluate for ischemia.

## 2016-02-13 NOTE — Telephone Encounter (Signed)
Informed patient of results and verbal understanding expressed.   Coronary CT ordered for scheduling. Patient agrees with treatment plan. 

## 2016-02-14 ENCOUNTER — Other Ambulatory Visit: Payer: Self-pay

## 2016-02-14 DIAGNOSIS — Z8249 Family history of ischemic heart disease and other diseases of the circulatory system: Secondary | ICD-10-CM

## 2016-02-14 DIAGNOSIS — R9439 Abnormal result of other cardiovascular function study: Secondary | ICD-10-CM

## 2016-02-14 DIAGNOSIS — G5632 Lesion of radial nerve, left upper limb: Secondary | ICD-10-CM | POA: Diagnosis not present

## 2016-02-14 DIAGNOSIS — M79602 Pain in left arm: Secondary | ICD-10-CM

## 2016-02-17 ENCOUNTER — Other Ambulatory Visit: Payer: Self-pay | Admitting: *Deleted

## 2016-02-17 ENCOUNTER — Encounter: Payer: Self-pay | Admitting: Cardiology

## 2016-02-17 DIAGNOSIS — Z79899 Other long term (current) drug therapy: Secondary | ICD-10-CM

## 2016-02-21 ENCOUNTER — Telehealth: Payer: Self-pay | Admitting: Cardiology

## 2016-02-21 DIAGNOSIS — G5602 Carpal tunnel syndrome, left upper limb: Secondary | ICD-10-CM | POA: Diagnosis not present

## 2016-02-21 DIAGNOSIS — M7552 Bursitis of left shoulder: Secondary | ICD-10-CM | POA: Diagnosis not present

## 2016-02-21 NOTE — Telephone Encounter (Signed)
New message     Pt stated she is having a procedure and that Ivin Booty set her an email and she was not able to open the attachment. She is calling to find out if any important instructions are on the attachment. Please do not call between 11-12 noon today any other time is fine.

## 2016-02-21 NOTE — Telephone Encounter (Signed)
Returned call to patient.She stated she received a email from our scheduler Mack Guise.Stated she wanted to let Ivin Booty know she was unable to open email.She wanted to make sure she received all her instructions for Ct scheduled this Fri 02/23/17.Advised I will send Mack Guise a message for her to call you.

## 2016-02-23 ENCOUNTER — Other Ambulatory Visit: Payer: Medicare Other | Admitting: *Deleted

## 2016-02-23 DIAGNOSIS — Z79899 Other long term (current) drug therapy: Secondary | ICD-10-CM

## 2016-02-23 LAB — BASIC METABOLIC PANEL
BUN: 20 mg/dL (ref 7–25)
CALCIUM: 8.9 mg/dL (ref 8.6–10.4)
CHLORIDE: 104 mmol/L (ref 98–110)
CO2: 27 mmol/L (ref 20–31)
CREATININE: 0.7 mg/dL (ref 0.60–0.93)
Glucose, Bld: 114 mg/dL — ABNORMAL HIGH (ref 65–99)
Potassium: 4.5 mmol/L (ref 3.5–5.3)
Sodium: 142 mmol/L (ref 135–146)

## 2016-02-24 ENCOUNTER — Ambulatory Visit (HOSPITAL_COMMUNITY)
Admission: RE | Admit: 2016-02-24 | Discharge: 2016-02-24 | Disposition: A | Discharge status: Home / Self Care | Payer: Medicare Other | Source: Ambulatory Visit | Attending: Cardiology | Admitting: Cardiology

## 2016-02-24 ENCOUNTER — Encounter (HOSPITAL_COMMUNITY): Payer: Self-pay

## 2016-02-24 DIAGNOSIS — I700 Atherosclerosis of aorta: Secondary | ICD-10-CM | POA: Insufficient documentation

## 2016-02-24 DIAGNOSIS — M79602 Pain in left arm: Secondary | ICD-10-CM | POA: Diagnosis not present

## 2016-02-24 DIAGNOSIS — Z8249 Family history of ischemic heart disease and other diseases of the circulatory system: Secondary | ICD-10-CM

## 2016-02-24 DIAGNOSIS — R931 Abnormal findings on diagnostic imaging of heart and coronary circulation: Secondary | ICD-10-CM | POA: Diagnosis not present

## 2016-02-24 DIAGNOSIS — R9439 Abnormal result of other cardiovascular function study: Secondary | ICD-10-CM

## 2016-02-24 MED ORDER — NITROGLYCERIN 0.4 MG SL SUBL
SUBLINGUAL_TABLET | SUBLINGUAL | Status: AC
  Filled 2016-02-24: qty 1

## 2016-02-24 MED ORDER — METOPROLOL TARTRATE 5 MG/5ML IV SOLN
5.0000 mg | Freq: Once | INTRAVENOUS | Status: AC
  Administered 2016-02-24: 5 mg via INTRAVENOUS

## 2016-02-24 MED ORDER — IOPAMIDOL (ISOVUE-370) INJECTION 76%
INTRAVENOUS | Status: AC
  Administered 2016-02-24: 80 mL
  Filled 2016-02-24: qty 100

## 2016-02-24 MED ORDER — NITROGLYCERIN 0.4 MG SL SUBL: 0.4000 mg | SUBLINGUAL_TABLET | SUBLINGUAL | Status: DC | PRN

## 2016-02-24 MED ORDER — METOPROLOL TARTRATE 5 MG/5ML IV SOLN
INTRAVENOUS | Status: AC
  Administered 2016-02-24: 5 mg via INTRAVENOUS
  Filled 2016-02-24: qty 5

## 2016-04-08 DIAGNOSIS — W19XXXA Unspecified fall, initial encounter: Secondary | ICD-10-CM | POA: Diagnosis not present

## 2016-04-08 DIAGNOSIS — I10 Essential (primary) hypertension: Secondary | ICD-10-CM | POA: Diagnosis not present

## 2016-04-08 DIAGNOSIS — S86812A Strain of other muscle(s) and tendon(s) at lower leg level, left leg, initial encounter: Secondary | ICD-10-CM | POA: Diagnosis not present

## 2016-04-08 DIAGNOSIS — S8392XA Sprain of unspecified site of left knee, initial encounter: Secondary | ICD-10-CM | POA: Diagnosis not present

## 2016-04-08 DIAGNOSIS — M25562 Pain in left knee: Secondary | ICD-10-CM | POA: Diagnosis not present

## 2016-04-08 DIAGNOSIS — Z87891 Personal history of nicotine dependence: Secondary | ICD-10-CM | POA: Diagnosis not present

## 2016-04-08 DIAGNOSIS — S8001XA Contusion of right knee, initial encounter: Secondary | ICD-10-CM | POA: Diagnosis not present

## 2016-04-08 DIAGNOSIS — Z79899 Other long term (current) drug therapy: Secondary | ICD-10-CM | POA: Diagnosis not present

## 2016-04-08 DIAGNOSIS — E119 Type 2 diabetes mellitus without complications: Secondary | ICD-10-CM | POA: Diagnosis not present

## 2016-04-08 DIAGNOSIS — Z7984 Long term (current) use of oral hypoglycemic drugs: Secondary | ICD-10-CM | POA: Diagnosis not present

## 2016-04-08 DIAGNOSIS — M25561 Pain in right knee: Secondary | ICD-10-CM | POA: Diagnosis not present

## 2016-04-08 DIAGNOSIS — M25461 Effusion, right knee: Secondary | ICD-10-CM | POA: Diagnosis not present

## 2016-04-25 DIAGNOSIS — S82141A Displaced bicondylar fracture of right tibia, initial encounter for closed fracture: Secondary | ICD-10-CM | POA: Diagnosis not present

## 2016-05-01 DIAGNOSIS — Z23 Encounter for immunization: Secondary | ICD-10-CM | POA: Diagnosis not present

## 2016-05-09 DIAGNOSIS — M25561 Pain in right knee: Secondary | ICD-10-CM | POA: Diagnosis not present

## 2016-08-01 DIAGNOSIS — S82141D Displaced bicondylar fracture of right tibia, subsequent encounter for closed fracture with routine healing: Secondary | ICD-10-CM | POA: Diagnosis not present

## 2016-08-01 DIAGNOSIS — M1711 Unilateral primary osteoarthritis, right knee: Secondary | ICD-10-CM | POA: Diagnosis not present

## 2016-10-01 DIAGNOSIS — M1711 Unilateral primary osteoarthritis, right knee: Secondary | ICD-10-CM | POA: Diagnosis not present

## 2016-11-02 DIAGNOSIS — Z8249 Family history of ischemic heart disease and other diseases of the circulatory system: Secondary | ICD-10-CM | POA: Diagnosis not present

## 2016-11-02 DIAGNOSIS — Z981 Arthrodesis status: Secondary | ICD-10-CM | POA: Diagnosis not present

## 2016-11-02 DIAGNOSIS — E782 Mixed hyperlipidemia: Secondary | ICD-10-CM | POA: Diagnosis not present

## 2016-11-02 DIAGNOSIS — E119 Type 2 diabetes mellitus without complications: Secondary | ICD-10-CM | POA: Diagnosis not present

## 2016-11-02 DIAGNOSIS — F439 Reaction to severe stress, unspecified: Secondary | ICD-10-CM | POA: Diagnosis not present

## 2016-11-02 DIAGNOSIS — I10 Essential (primary) hypertension: Secondary | ICD-10-CM | POA: Diagnosis not present

## 2016-11-15 DIAGNOSIS — R0609 Other forms of dyspnea: Secondary | ICD-10-CM | POA: Diagnosis not present

## 2016-11-15 DIAGNOSIS — E119 Type 2 diabetes mellitus without complications: Secondary | ICD-10-CM | POA: Diagnosis not present

## 2016-11-15 DIAGNOSIS — R600 Localized edema: Secondary | ICD-10-CM | POA: Diagnosis not present

## 2016-11-15 DIAGNOSIS — E782 Mixed hyperlipidemia: Secondary | ICD-10-CM | POA: Diagnosis not present

## 2016-11-15 DIAGNOSIS — I10 Essential (primary) hypertension: Secondary | ICD-10-CM | POA: Diagnosis not present

## 2016-11-22 DIAGNOSIS — H2513 Age-related nuclear cataract, bilateral: Secondary | ICD-10-CM | POA: Diagnosis not present

## 2016-11-22 DIAGNOSIS — E119 Type 2 diabetes mellitus without complications: Secondary | ICD-10-CM | POA: Diagnosis not present

## 2016-12-06 DIAGNOSIS — I340 Nonrheumatic mitral (valve) insufficiency: Secondary | ICD-10-CM | POA: Diagnosis not present

## 2016-12-10 DIAGNOSIS — I890 Lymphedema, not elsewhere classified: Secondary | ICD-10-CM | POA: Diagnosis not present

## 2016-12-25 DIAGNOSIS — I890 Lymphedema, not elsewhere classified: Secondary | ICD-10-CM | POA: Diagnosis not present

## 2016-12-27 DIAGNOSIS — I890 Lymphedema, not elsewhere classified: Secondary | ICD-10-CM | POA: Diagnosis not present

## 2017-01-01 DIAGNOSIS — I890 Lymphedema, not elsewhere classified: Secondary | ICD-10-CM | POA: Diagnosis not present

## 2017-01-04 DIAGNOSIS — I890 Lymphedema, not elsewhere classified: Secondary | ICD-10-CM | POA: Diagnosis not present

## 2017-01-14 DIAGNOSIS — I890 Lymphedema, not elsewhere classified: Secondary | ICD-10-CM | POA: Diagnosis not present

## 2017-01-16 DIAGNOSIS — I890 Lymphedema, not elsewhere classified: Secondary | ICD-10-CM | POA: Diagnosis not present

## 2017-01-23 DIAGNOSIS — I890 Lymphedema, not elsewhere classified: Secondary | ICD-10-CM | POA: Diagnosis not present

## 2017-01-25 DIAGNOSIS — I890 Lymphedema, not elsewhere classified: Secondary | ICD-10-CM | POA: Diagnosis not present

## 2017-01-28 DIAGNOSIS — I890 Lymphedema, not elsewhere classified: Secondary | ICD-10-CM | POA: Diagnosis not present

## 2017-02-01 DIAGNOSIS — E119 Type 2 diabetes mellitus without complications: Secondary | ICD-10-CM | POA: Diagnosis not present

## 2017-02-01 DIAGNOSIS — R600 Localized edema: Secondary | ICD-10-CM | POA: Diagnosis not present

## 2017-02-01 DIAGNOSIS — E782 Mixed hyperlipidemia: Secondary | ICD-10-CM | POA: Diagnosis not present

## 2017-02-01 DIAGNOSIS — I10 Essential (primary) hypertension: Secondary | ICD-10-CM | POA: Diagnosis not present

## 2017-02-01 DIAGNOSIS — Z0000 Encounter for general adult medical examination without abnormal findings: Secondary | ICD-10-CM | POA: Diagnosis not present

## 2017-02-04 DIAGNOSIS — I890 Lymphedema, not elsewhere classified: Secondary | ICD-10-CM | POA: Diagnosis not present

## 2017-02-06 DIAGNOSIS — I890 Lymphedema, not elsewhere classified: Secondary | ICD-10-CM | POA: Diagnosis not present

## 2017-02-08 DIAGNOSIS — E119 Type 2 diabetes mellitus without complications: Secondary | ICD-10-CM | POA: Diagnosis not present

## 2017-02-08 DIAGNOSIS — R600 Localized edema: Secondary | ICD-10-CM | POA: Diagnosis not present

## 2017-02-08 DIAGNOSIS — R0609 Other forms of dyspnea: Secondary | ICD-10-CM | POA: Diagnosis not present

## 2017-02-08 DIAGNOSIS — E782 Mixed hyperlipidemia: Secondary | ICD-10-CM | POA: Diagnosis not present

## 2017-02-08 DIAGNOSIS — I10 Essential (primary) hypertension: Secondary | ICD-10-CM | POA: Diagnosis not present

## 2017-02-11 DIAGNOSIS — I890 Lymphedema, not elsewhere classified: Secondary | ICD-10-CM | POA: Diagnosis not present

## 2017-02-13 DIAGNOSIS — I890 Lymphedema, not elsewhere classified: Secondary | ICD-10-CM | POA: Diagnosis not present

## 2017-02-18 DIAGNOSIS — I890 Lymphedema, not elsewhere classified: Secondary | ICD-10-CM | POA: Diagnosis not present

## 2017-02-20 DIAGNOSIS — I890 Lymphedema, not elsewhere classified: Secondary | ICD-10-CM | POA: Diagnosis not present

## 2017-03-06 DIAGNOSIS — I890 Lymphedema, not elsewhere classified: Secondary | ICD-10-CM | POA: Diagnosis not present

## 2017-03-19 DIAGNOSIS — I890 Lymphedema, not elsewhere classified: Secondary | ICD-10-CM | POA: Diagnosis not present

## 2017-03-20 DIAGNOSIS — I890 Lymphedema, not elsewhere classified: Secondary | ICD-10-CM | POA: Diagnosis not present

## 2017-03-26 DIAGNOSIS — I890 Lymphedema, not elsewhere classified: Secondary | ICD-10-CM | POA: Diagnosis not present

## 2017-04-02 DIAGNOSIS — I890 Lymphedema, not elsewhere classified: Secondary | ICD-10-CM | POA: Diagnosis not present

## 2017-04-09 DIAGNOSIS — I890 Lymphedema, not elsewhere classified: Secondary | ICD-10-CM | POA: Diagnosis not present

## 2017-04-15 DIAGNOSIS — Z23 Encounter for immunization: Secondary | ICD-10-CM | POA: Diagnosis not present

## 2017-04-16 DIAGNOSIS — I890 Lymphedema, not elsewhere classified: Secondary | ICD-10-CM | POA: Diagnosis not present

## 2017-05-07 DIAGNOSIS — I890 Lymphedema, not elsewhere classified: Secondary | ICD-10-CM | POA: Diagnosis not present

## 2017-05-09 DIAGNOSIS — I10 Essential (primary) hypertension: Secondary | ICD-10-CM | POA: Diagnosis not present

## 2017-05-09 DIAGNOSIS — F419 Anxiety disorder, unspecified: Secondary | ICD-10-CM | POA: Diagnosis not present

## 2017-05-09 DIAGNOSIS — R238 Other skin changes: Secondary | ICD-10-CM | POA: Diagnosis not present

## 2017-05-09 DIAGNOSIS — E119 Type 2 diabetes mellitus without complications: Secondary | ICD-10-CM | POA: Diagnosis not present

## 2017-05-14 DIAGNOSIS — I890 Lymphedema, not elsewhere classified: Secondary | ICD-10-CM | POA: Diagnosis not present

## 2017-08-15 DIAGNOSIS — E119 Type 2 diabetes mellitus without complications: Secondary | ICD-10-CM | POA: Diagnosis not present

## 2017-08-15 DIAGNOSIS — Z1231 Encounter for screening mammogram for malignant neoplasm of breast: Secondary | ICD-10-CM | POA: Diagnosis not present

## 2017-10-21 DIAGNOSIS — Z1231 Encounter for screening mammogram for malignant neoplasm of breast: Secondary | ICD-10-CM | POA: Diagnosis not present

## 2017-11-25 DIAGNOSIS — E11319 Type 2 diabetes mellitus with unspecified diabetic retinopathy without macular edema: Secondary | ICD-10-CM | POA: Diagnosis not present

## 2017-11-25 DIAGNOSIS — H2513 Age-related nuclear cataract, bilateral: Secondary | ICD-10-CM | POA: Diagnosis not present

## 2017-12-05 DIAGNOSIS — E119 Type 2 diabetes mellitus without complications: Secondary | ICD-10-CM | POA: Diagnosis not present

## 2017-12-05 DIAGNOSIS — I10 Essential (primary) hypertension: Secondary | ICD-10-CM | POA: Diagnosis not present

## 2017-12-05 DIAGNOSIS — E782 Mixed hyperlipidemia: Secondary | ICD-10-CM | POA: Diagnosis not present

## 2018-03-11 DIAGNOSIS — I10 Essential (primary) hypertension: Secondary | ICD-10-CM | POA: Diagnosis not present

## 2018-03-11 DIAGNOSIS — Z0000 Encounter for general adult medical examination without abnormal findings: Secondary | ICD-10-CM | POA: Diagnosis not present

## 2018-03-11 DIAGNOSIS — E119 Type 2 diabetes mellitus without complications: Secondary | ICD-10-CM | POA: Diagnosis not present

## 2018-03-11 DIAGNOSIS — F419 Anxiety disorder, unspecified: Secondary | ICD-10-CM | POA: Diagnosis not present

## 2018-03-11 DIAGNOSIS — E782 Mixed hyperlipidemia: Secondary | ICD-10-CM | POA: Diagnosis not present

## 2018-03-11 DIAGNOSIS — M1611 Unilateral primary osteoarthritis, right hip: Secondary | ICD-10-CM | POA: Diagnosis not present

## 2018-03-11 DIAGNOSIS — Z780 Asymptomatic menopausal state: Secondary | ICD-10-CM | POA: Diagnosis not present

## 2018-03-17 DIAGNOSIS — Z13820 Encounter for screening for osteoporosis: Secondary | ICD-10-CM | POA: Diagnosis not present

## 2018-03-17 DIAGNOSIS — Z780 Asymptomatic menopausal state: Secondary | ICD-10-CM | POA: Diagnosis not present

## 2018-03-17 DIAGNOSIS — M8589 Other specified disorders of bone density and structure, multiple sites: Secondary | ICD-10-CM | POA: Diagnosis not present

## 2018-03-20 DIAGNOSIS — M1611 Unilateral primary osteoarthritis, right hip: Secondary | ICD-10-CM | POA: Diagnosis not present

## 2018-03-20 DIAGNOSIS — G8929 Other chronic pain: Secondary | ICD-10-CM | POA: Diagnosis not present

## 2018-03-20 DIAGNOSIS — M25551 Pain in right hip: Secondary | ICD-10-CM | POA: Diagnosis not present

## 2018-03-29 IMAGING — CR DG CHEST 2V
2 series · 2 of 2 positions shown · non-contrast
Comparison: None

CLINICAL DATA: LEFT shoulder pain, chest pain, history asthma,
diabetes mellitus, obesity, former smoker

EXAM:
CHEST  2 VIEW

[chest pa]
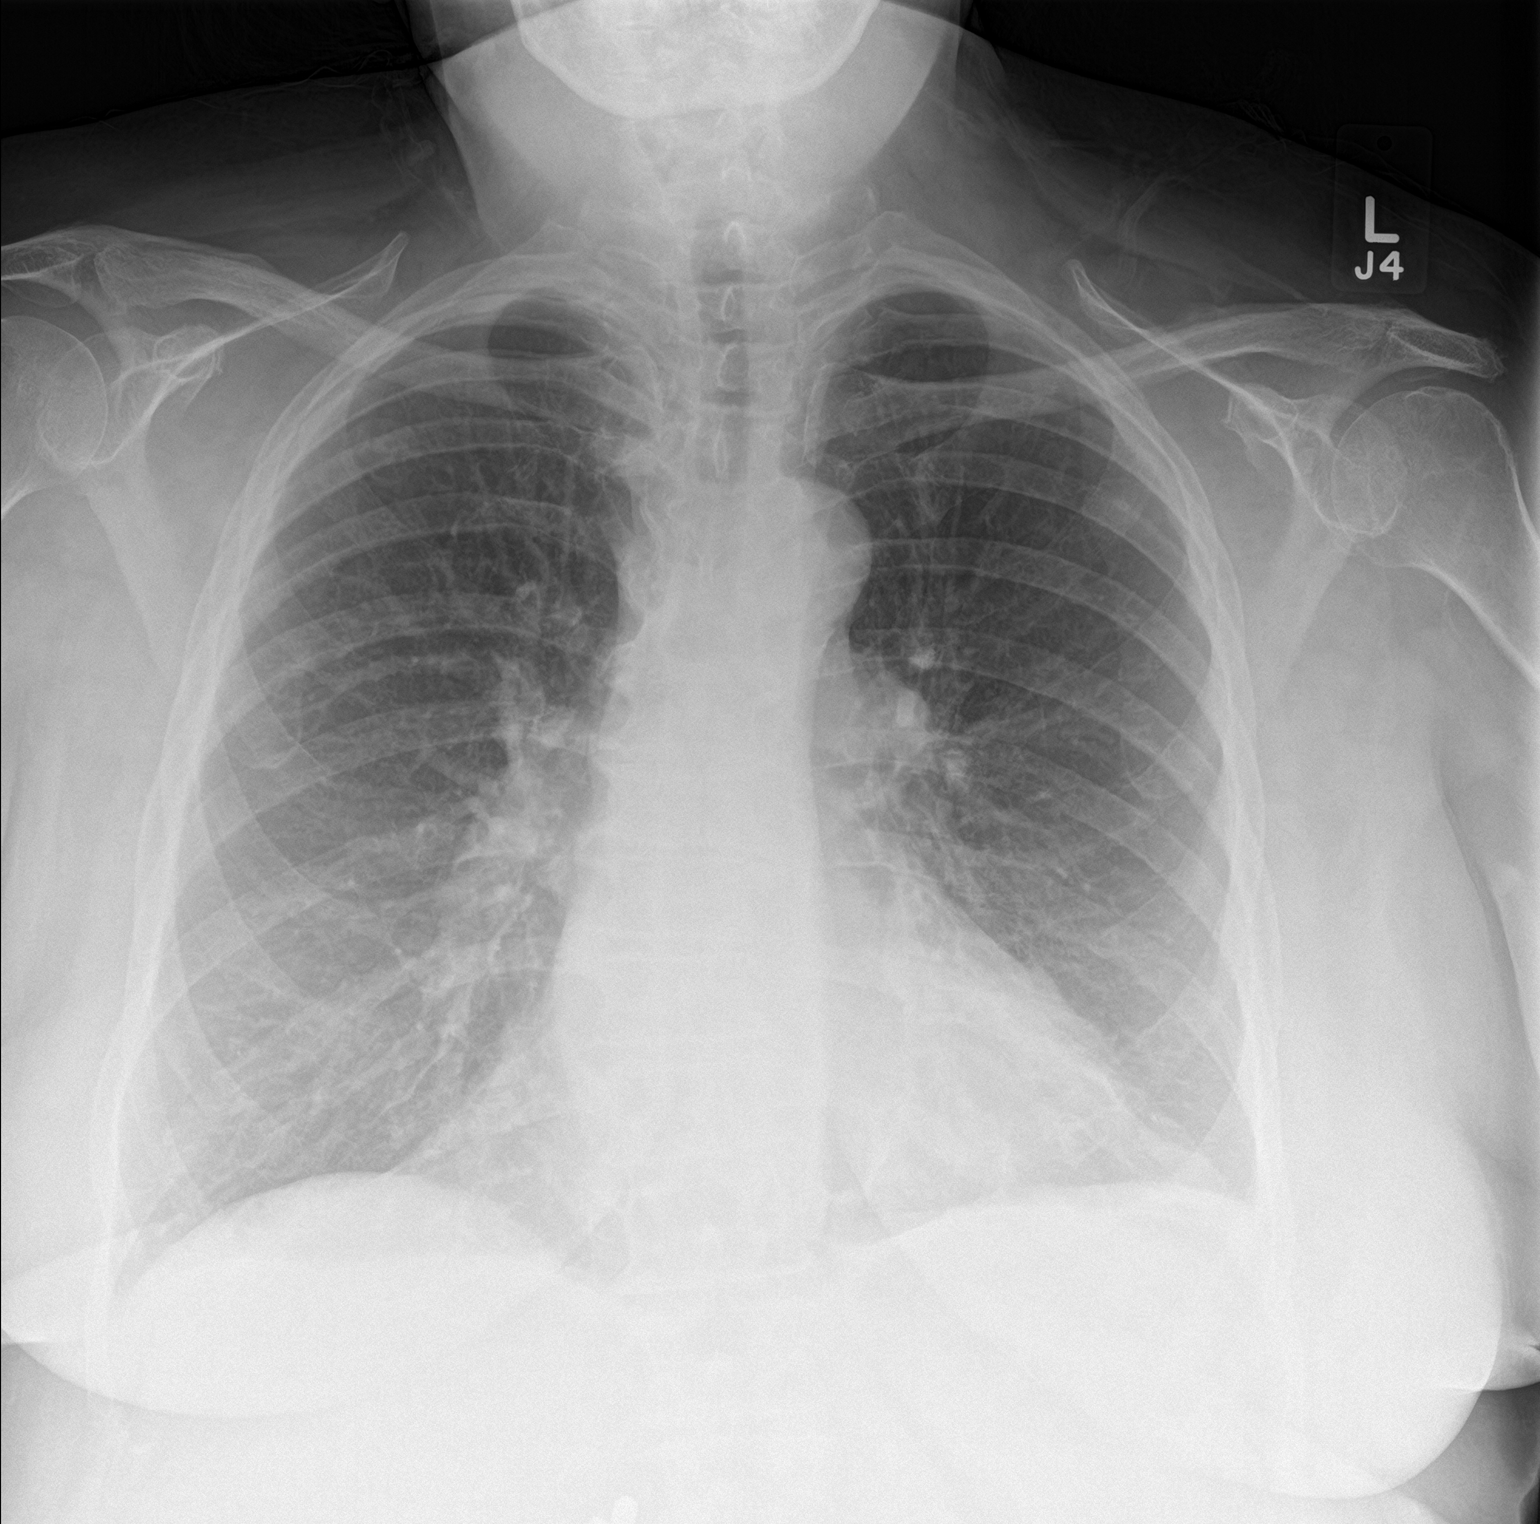

[chest lat]
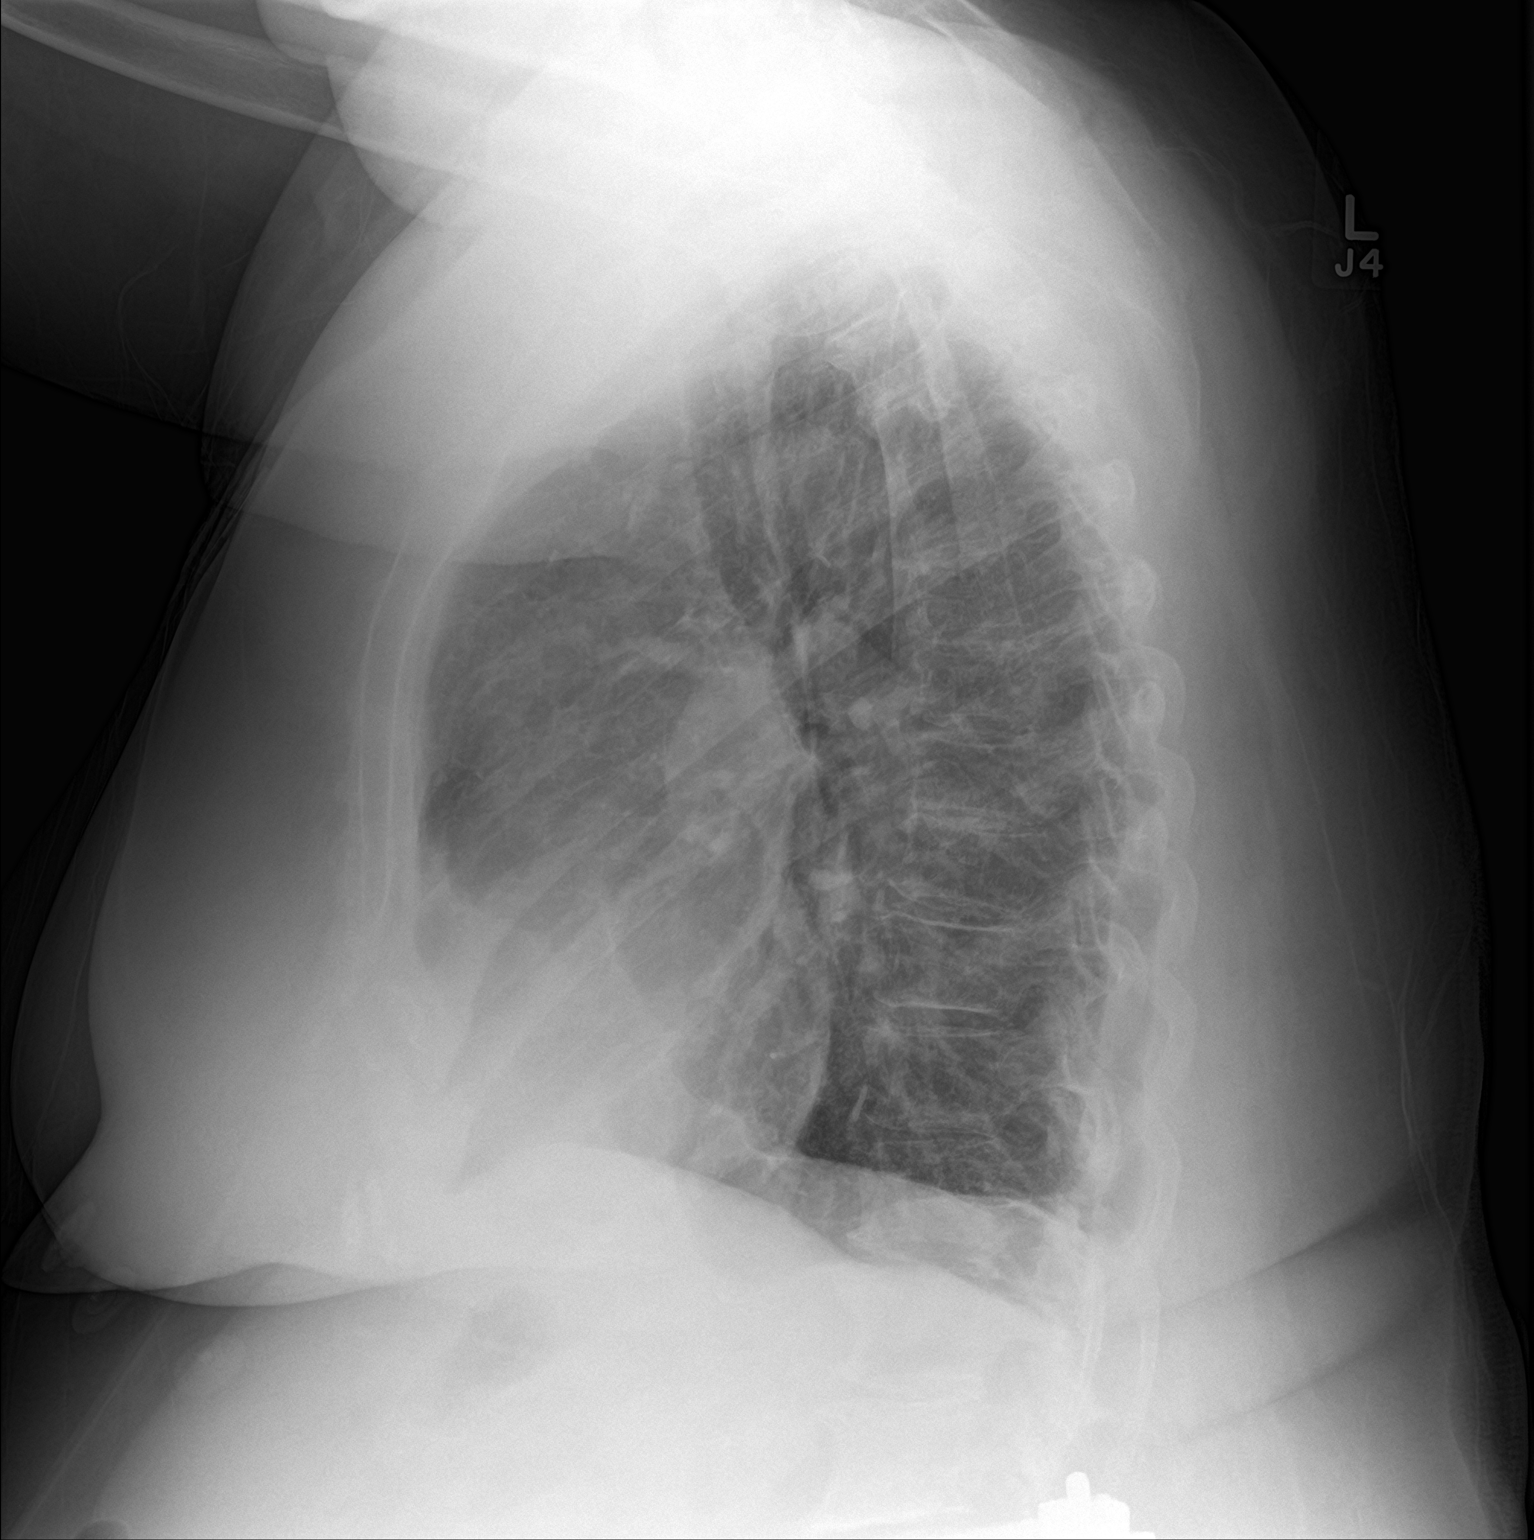

[2 of 2 positions shown; findings below may reference images not displayed]

FINDINGS: Upper normal heart size.

Mediastinal contours and pulmonary vascularity normal.

Lungs clear.

No pleural effusion or pneumothorax.

Bones demineralized.
IMPRESSION: No acute abnormalities.

## 2018-04-18 DIAGNOSIS — Z23 Encounter for immunization: Secondary | ICD-10-CM | POA: Diagnosis not present

## 2018-05-09 IMAGING — NM NM MISC PROCEDURE
6 series · 36 of 36 positions shown · non-contrast
Comparison: none

[Series 1: rest · 6.40mm/px · 6 of 64 frames shown]
[frame 6/64]
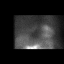
[frame 16/64]
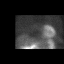
[frame 27/64]
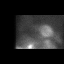
[frame 38/64]
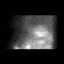
[frame 48/64]
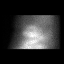
[frame 59/64]
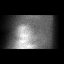

[Series 1: wbr_s-proj_st stress-gsp · 6.40mm/px · 6 of 512 frames shown]
[frame 43/512]
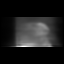
[frame 128/512]
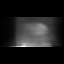
[frame 214/512]
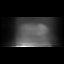
[frame 299/512]
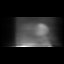
[frame 384/512]
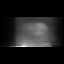
[frame 470/512]
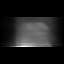

[Series 1: wbr_r-proj_st rest · 6.40mm/px · 6 of 64 frames shown]
[frame 6/64]
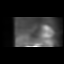
[frame 16/64]
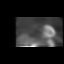
[frame 27/64]
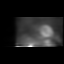
[frame 38/64]
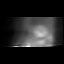
[frame 48/64]
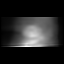
[frame 59/64]
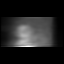

[Series 1: stress-gsp · 6.40mm/px · 6 of 510 frames shown]
[frame 43/510  full-range]
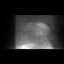
[frame 128/510  full-range]
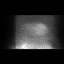
[frame 213/510  full-range]
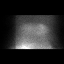
[frame 298/510  full-range]
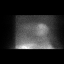
[frame 383/510  full-range]
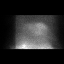
[frame 468/510  full-range]
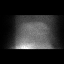

[Series 1: stress-sum-em · 6.40mm/px · 6 of 64 frames shown]
[frame 6/64]
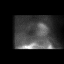
[frame 16/64]
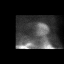
[frame 27/64]
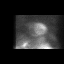
[frame 38/64]
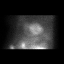
[frame 48/64]
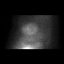
[frame 59/64]
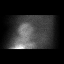

[Series 1: wbr_s-proj_st stress-sum-em · 6.40mm/px · 6 of 64 frames shown]
[frame 6/64]
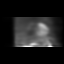
[frame 16/64]
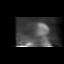
[frame 27/64]
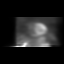
[frame 38/64]
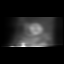
[frame 48/64]
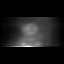
[frame 59/64]
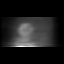

[36 of 36 positions shown; findings below may reference images not displayed]

Canned report from images found in remote index.

Refer to host system for actual result text.
